# Patient Record
Sex: Female | Born: 1960 | Hispanic: Yes | Marital: Single | Smoking: Former smoker
Health system: Southern US, Community
[De-identification: ages and names within clinical notes are randomized; demographics above are authoritative.]

## PROBLEM LIST (undated history)

## (undated) ENCOUNTER — Emergency Department (HOSPITAL_COMMUNITY): Admission: EM | Payer: Self-pay | Source: Home / Self Care

## (undated) DIAGNOSIS — F32A Depression, unspecified: Secondary | ICD-10-CM

## (undated) DIAGNOSIS — R7303 Prediabetes: Secondary | ICD-10-CM

## (undated) DIAGNOSIS — F329 Major depressive disorder, single episode, unspecified: Secondary | ICD-10-CM

## (undated) DIAGNOSIS — E119 Type 2 diabetes mellitus without complications: Secondary | ICD-10-CM

## (undated) DIAGNOSIS — M1990 Unspecified osteoarthritis, unspecified site: Secondary | ICD-10-CM

## (undated) DIAGNOSIS — G8929 Other chronic pain: Secondary | ICD-10-CM

## (undated) DIAGNOSIS — J45909 Unspecified asthma, uncomplicated: Secondary | ICD-10-CM

## (undated) DIAGNOSIS — E7800 Pure hypercholesterolemia, unspecified: Secondary | ICD-10-CM

## (undated) DIAGNOSIS — I10 Essential (primary) hypertension: Secondary | ICD-10-CM

## (undated) HISTORY — DX: Unspecified asthma, uncomplicated: J45.909

## (undated) HISTORY — PX: ABDOMINAL HYSTERECTOMY: SHX81

## (undated) HISTORY — DX: Unspecified osteoarthritis, unspecified site: M19.90

## (undated) HISTORY — DX: Type 2 diabetes mellitus without complications: E11.9

## (undated) HISTORY — PX: TUBAL LIGATION: SHX77

---

## 2011-08-28 ENCOUNTER — Other Ambulatory Visit (HOSPITAL_COMMUNITY): Payer: Self-pay | Admitting: Nurse Practitioner

## 2011-08-28 DIAGNOSIS — N6452 Nipple discharge: Secondary | ICD-10-CM

## 2011-08-29 ENCOUNTER — Ambulatory Visit (HOSPITAL_COMMUNITY)
Admission: RE | Admit: 2011-08-29 | Discharge: 2011-08-29 | Disposition: A | Discharge status: Home / Self Care | Payer: PRIVATE HEALTH INSURANCE | Source: Ambulatory Visit | Attending: Nurse Practitioner | Admitting: Nurse Practitioner

## 2011-08-29 DIAGNOSIS — N644 Mastodynia: Secondary | ICD-10-CM | POA: Insufficient documentation

## 2011-08-29 DIAGNOSIS — N6459 Other signs and symptoms in breast: Secondary | ICD-10-CM | POA: Insufficient documentation

## 2011-08-29 DIAGNOSIS — N6452 Nipple discharge: Secondary | ICD-10-CM

## 2017-11-28 ENCOUNTER — Emergency Department (HOSPITAL_COMMUNITY)
Admission: EM | Admit: 2017-11-28 | Discharge: 2017-11-28 | Disposition: A | Discharge status: Home / Self Care | Payer: Self-pay | Attending: Emergency Medicine | Admitting: Emergency Medicine

## 2017-11-28 ENCOUNTER — Emergency Department (HOSPITAL_COMMUNITY): Payer: Self-pay

## 2017-11-28 ENCOUNTER — Other Ambulatory Visit: Payer: Self-pay

## 2017-11-28 ENCOUNTER — Encounter (HOSPITAL_COMMUNITY): Payer: Self-pay | Admitting: Emergency Medicine

## 2017-11-28 DIAGNOSIS — I10 Essential (primary) hypertension: Secondary | ICD-10-CM | POA: Insufficient documentation

## 2017-11-28 DIAGNOSIS — M25511 Pain in right shoulder: Secondary | ICD-10-CM | POA: Insufficient documentation

## 2017-11-28 DIAGNOSIS — M899 Disorder of bone, unspecified: Secondary | ICD-10-CM | POA: Insufficient documentation

## 2017-11-28 HISTORY — DX: Depression, unspecified: F32.A

## 2017-11-28 HISTORY — DX: Pure hypercholesterolemia, unspecified: E78.00

## 2017-11-28 HISTORY — DX: Other chronic pain: G89.29

## 2017-11-28 HISTORY — DX: Essential (primary) hypertension: I10

## 2017-11-28 HISTORY — DX: Major depressive disorder, single episode, unspecified: F32.9

## 2017-11-28 MED ORDER — IBUPROFEN 600 MG PO TABS: 600.0000 mg | ORAL_TABLET | Freq: Three times a day (TID) | ORAL | 0 refills | Status: DC

## 2017-11-28 NOTE — Discharge Instructions (Addendum)
Try applying ice packs on and off to your right shoulder.  Avoid heavy lifting with your right arm.  As discussed, the x-ray of your right shoulder shows an area of the bone that needs evaluation by an orthopedic doctor.  I have listed someone for you to call and make a follow-up appointment with.

## 2017-11-28 NOTE — ED Triage Notes (Signed)
Pt is spanish speaking and using visitor for translations. States R shoulder pain for a long period of time but two days ago became worse. Denies any injury. No improvement with home medications.

## 2017-11-28 NOTE — ED Provider Notes (Signed)
[facility] EMERGENCY DEPARTMENT Provider Note   CSN: [id number] Arrival date & time: 11/28/17  1512     History   Chief Complaint Chief Complaint  Patient presents with  . Shoulder Pain    HPI Laurie Olsen is a 57 y.o. female.  The history is limited by a language barrier. A language interpreter was used (daughter in law).  Shoulder Pain   Pertinent negatives include no numbness.     Laurie Olsen is a 57 y.o. female who presents to the Emergency Department complaining of persistent right shoulder pain.  She describes a constant sharp pain to the front of her shoulder that worsens with movement, especially trying to raise her arm up to brush her teeth or comb her hair.  He symptoms began 8 months ago, but has worsened for few days and she now states she has occasional pain that radiates down her arm.  She has tried OTC pain relievers without significant improvement.  She denies known injury, fever, chills, swelling, neck pain, headache, numbness or weakness of her arm.    Past Medical History:  Diagnosis Date  . Chronic pain   . Depression   . High cholesterol   . Hypertension     There are no active problems to display for this patient.   Past Surgical History:  Procedure Laterality Date  . ABDOMINAL HYSTERECTOMY       OB History   None      Home Medications    Prior to Admission medications   Medication Sig Start Date End Date Taking? Authorizing Provider  ibuprofen (ADVIL,MOTRIN) 600 MG tablet Take 1 tablet (600 mg total) by mouth 3 (three) times daily. Take with food 11/28/17   Kem Parkinson, PA-C    Family History History reviewed. No pertinent family history.  Social History Social History   Tobacco Use  . Smoking status: Never Smoker  . Smokeless tobacco: Never Used  Substance Use Topics  . Alcohol use: Not Currently  . Drug use: Not Currently     Allergies   Patient has no known allergies.   Review of Systems Review of Systems    Constitutional: Negative for chills and fever.  Eyes: Negative for visual disturbance.  Respiratory: Negative for chest tightness and shortness of breath.   Cardiovascular: Negative for chest pain.  Musculoskeletal: Positive for arthralgias (right shoulder pain). Negative for joint swelling, neck pain and neck stiffness.  Skin: Negative for color change and wound.  Neurological: Negative for dizziness, weakness, numbness and headaches.     Physical Exam Updated Vital Signs BP 136/82 (BP Location: Right Arm)   Pulse 97   Temp 98.8 F (37.1 C) (Oral)   Resp 14   Ht 5\' 5"  (1.651 m)   Wt 86.2 kg   SpO2 99%   BMI 31.62 kg/m   Physical Exam  Constitutional: She appears well-nourished. No distress.  HENT:  Head: Atraumatic.  Neck: Normal range of motion and full passive range of motion without pain.  Cardiovascular: Normal rate, regular rhythm and intact distal pulses.  No murmur heard. Pulmonary/Chest: Effort normal and breath sounds normal. No respiratory distress. She exhibits no tenderness.  Musculoskeletal: She exhibits tenderness. She exhibits no edema or deformity.  Focal ttp of the [facility] joint of the right shoulder.  No crepitus.  Pt unable to abduct the right arm greater than 30 degrees due to level of pain.  FROM of the right elbow and wrist.    Neurological: She is alert. No  sensory deficit.  Skin: Skin is warm. Capillary refill takes less than 2 seconds. No rash noted.  Psychiatric: She has a normal mood and affect.  Nursing note and vitals reviewed.    ED Treatments / Results  Labs (all labs ordered are listed, but only abnormal results are displayed) Labs Reviewed - No data to display  EKG None  Radiology Dg Shoulder Right  Result Date: 11/28/2017 CLINICAL DATA:  58 year old female with 2 months of right shoulder pain. Progressive symptoms x2 days but no known injury. EXAM: RIGHT SHOULDER - 2+ VIEW COMPARISON:  None. FINDINGS: There is mild speckled sclerosis  in the right humerus metadiaphysis (arrow). The proximal right humerus is intact. No periosteal reaction. Bone mineralization elsewhere appears normal. No glenohumeral joint dislocation. Right clavicle and scapula appear intact. Negative visible right chest. IMPRESSION: No acute fracture or dislocation about the right shoulder, but nonspecific sclerosis at the right proximal humerus metadiaphysis is noted. Although a benign bone lesion such as enchondroma is favored, if pain is unresponsive to conservative treatment a follow-up shoulder MRI is recommended. Electronically Signed   By: Genevie Ann M.D.   On: 11/28/2017 15:54    Procedures Procedures (including critical care time)  Medications Ordered in ED Medications - No data to display   Initial Impression / Assessment and Plan / ED Course  I have reviewed the triage vital signs and the nursing notes.  Pertinent labs & imaging results that were available during my care of the patient were reviewed by me and considered in my medical decision making (see chart for details).     XR results discussed with patient and family member.  Pain is localized to the [facility] joint and her Sx's are likely inflammatory.  she does not have bony tenderness at the site of the bony lesion.  I do not feel this is the source of her pain, but I have explained that she needs close orthopedic f/u for further evaluation.  She verbalizes understanding of this and agrees to plan.  Referral info given.  Return precautions discussed.   Final Clinical Impressions(s) / ED Diagnoses   Final diagnoses:  Acute pain of right shoulder  Bone lesion    ED Discharge Orders         Ordered    ibuprofen (ADVIL,MOTRIN) 600 MG tablet  3 times daily     11/28/17 1648           Kem Parkinson, PA-C 11/28/17 2301    Noemi Chapel, MD 11/29/17 1229

## 2019-09-28 ENCOUNTER — Other Ambulatory Visit (HOSPITAL_COMMUNITY): Payer: Self-pay | Admitting: *Deleted

## 2019-09-28 ENCOUNTER — Other Ambulatory Visit: Payer: Self-pay | Admitting: *Deleted

## 2019-09-28 DIAGNOSIS — R109 Unspecified abdominal pain: Secondary | ICD-10-CM

## 2019-09-28 DIAGNOSIS — M545 Low back pain, unspecified: Secondary | ICD-10-CM

## 2019-10-02 ENCOUNTER — Ambulatory Visit (HOSPITAL_COMMUNITY)
Admission: RE | Admit: 2019-10-02 | Discharge: 2019-10-02 | Disposition: A | Discharge status: Home / Self Care | Payer: Self-pay | Source: Ambulatory Visit | Attending: *Deleted | Admitting: *Deleted

## 2019-10-02 ENCOUNTER — Other Ambulatory Visit: Payer: Self-pay

## 2019-10-02 DIAGNOSIS — M545 Low back pain, unspecified: Secondary | ICD-10-CM

## 2019-10-02 DIAGNOSIS — R109 Unspecified abdominal pain: Secondary | ICD-10-CM

## 2020-08-11 ENCOUNTER — Other Ambulatory Visit (HOSPITAL_COMMUNITY): Payer: Self-pay | Admitting: *Deleted

## 2020-08-11 DIAGNOSIS — Z1231 Encounter for screening mammogram for malignant neoplasm of breast: Secondary | ICD-10-CM

## 2020-08-24 ENCOUNTER — Other Ambulatory Visit (HOSPITAL_COMMUNITY): Payer: Self-pay | Admitting: Obstetrics

## 2020-09-05 ENCOUNTER — Other Ambulatory Visit: Payer: Self-pay

## 2020-09-05 ENCOUNTER — Ambulatory Visit (HOSPITAL_COMMUNITY)
Admission: RE | Admit: 2020-09-05 | Discharge: 2020-09-05 | Disposition: A | Discharge status: Home / Self Care | Payer: Self-pay | Source: Ambulatory Visit | Attending: *Deleted | Admitting: *Deleted

## 2020-09-05 DIAGNOSIS — Z1231 Encounter for screening mammogram for malignant neoplasm of breast: Secondary | ICD-10-CM | POA: Insufficient documentation

## 2020-09-08 ENCOUNTER — Other Ambulatory Visit (HOSPITAL_COMMUNITY): Payer: Self-pay | Admitting: Physician Assistant

## 2020-09-08 DIAGNOSIS — R928 Other abnormal and inconclusive findings on diagnostic imaging of breast: Secondary | ICD-10-CM

## 2020-09-22 ENCOUNTER — Ambulatory Visit (HOSPITAL_COMMUNITY)
Admission: RE | Admit: 2020-09-22 | Discharge: 2020-09-22 | Disposition: A | Discharge status: Home / Self Care | Payer: Self-pay | Source: Ambulatory Visit | Attending: Physician Assistant | Admitting: Physician Assistant

## 2020-09-22 ENCOUNTER — Other Ambulatory Visit: Payer: Self-pay

## 2020-09-22 DIAGNOSIS — R928 Other abnormal and inconclusive findings on diagnostic imaging of breast: Secondary | ICD-10-CM

## 2020-09-29 ENCOUNTER — Other Ambulatory Visit: Payer: Self-pay | Admitting: Physician Assistant

## 2020-09-29 DIAGNOSIS — R921 Mammographic calcification found on diagnostic imaging of breast: Secondary | ICD-10-CM

## 2020-10-04 ENCOUNTER — Ambulatory Visit: Payer: Self-pay | Admitting: *Deleted

## 2020-10-04 ENCOUNTER — Other Ambulatory Visit: Payer: Self-pay

## 2020-10-04 VITALS — BP 136/74 | Wt 193.9 lb

## 2020-10-04 DIAGNOSIS — Z1239 Encounter for other screening for malignant neoplasm of breast: Secondary | ICD-10-CM

## 2020-10-04 NOTE — Progress Notes (Signed)
Ms. Laurie Olsen is a 60 y.o. female who presents to [facility] clinic today with no complaints. Patient referred to [place] by the [facility] Department due to having a screening mammogram completed 09/05/2020 that additional imaging of the right breast recommended for follow-up. Patient had the right breast diagnostic mammogram completed 09/22/2020 that was bi-rads 4 with a right breast stereo biopsy recommended for follow-up.   Pap Smear: Pap smear not completed today. Last Pap smear was 08/11/2020 at the [facility] clinic and was normal with positive HPV. Per patient has history of an abnormal Pap smear 16 years ago that a colposcopy and either LEEP or CKC was completed for follow-up. Last Pap smear result is not available in Epic. Last Pap smear result will be scanned into Epic.   Physical exam: Breasts Breasts symmetrical. No skin abnormalities bilateral breasts. No nipple retraction bilateral breasts. No nipple discharge bilateral breasts. No lymphadenopathy. No lumps palpated bilateral breasts. No complaints of pain or tenderness on exam.     MS DIGITAL SCREENING TOMO BILATERAL  Result Date: 09/07/2020 CLINICAL DATA:  Screening. EXAM: DIGITAL SCREENING BILATERAL MAMMOGRAM WITH TOMOSYNTHESIS AND CAD TECHNIQUE: Bilateral screening digital craniocaudal and mediolateral oblique mammograms were obtained. Bilateral screening digital breast tomosynthesis was performed. The images were evaluated with computer-aided detection. COMPARISON:  Previous exam(s). ACR Breast Density Category b: There are scattered areas of fibroglandular density. FINDINGS: In the right breast, calcifications warrant further evaluation with magnified views. In the left breast, no findings suspicious for malignancy. IMPRESSION: Further evaluation is suggested for calcifications in the right breast. RECOMMENDATION: Diagnostic mammogram of the right breast. (Code:[id number]) The patient will be contacted  regarding the findings, and additional imaging will be scheduled. BI-RADS CATEGORY  0: Incomplete. Need additional imaging evaluation and/or prior mammograms for comparison. Electronically Signed   By: Lillia Mountain M.D.   On: 09/07/2020 11:25   MS DIGITAL DIAG TOMO UNI RIGHT  Result Date: 09/22/2020 CLINICAL DATA:  Patient returns after screening study for evaluation of RIGHT breast calcifications. EXAM: DIGITAL DIAGNOSTIC UNILATERAL RIGHT MAMMOGRAM WITH TOMOSYNTHESIS AND CAD TECHNIQUE: Right digital diagnostic mammography and breast tomosynthesis was performed. The images were evaluated with computer-aided detection. COMPARISON:  Previous exam(s). ACR Breast Density Category c: The breast tissue is heterogeneously dense, which may obscure small masses. FINDINGS: Magnified views are performed of calcifications in the RIGHT breast. Magnified views show calcifications with punctate and polygonal shapes, in a linear distribution. Calcifications span 2.0 x 0.2 x 0.4 centimeters in the [place]. IMPRESSION: Indeterminate RIGHT breast calcifications. RECOMMENDATION: Stereotactic guided core biopsy of RIGHT breast calcifications. I have discussed the findings and recommendations with the patient with the assistance of an interpreter. If applicable, a reminder letter will be sent to the patient regarding the next appointment. BI-RADS CATEGORY  4: Suspicious. Electronically Signed   By: Nolon Nations M.D.   On: 09/22/2020 10:00   Pelvic/Bimanual Pap is not indicated today per BCCCP guidelines.    Smoking History: Patient is a former smoker that quit 41 years ago.   Patient Navigation: Patient education provided. Access to services provided for patient through [facility] program. Spanish interpreter Rudene Anda from [facility] provided. Patient has food insecurities. Patient escorted to the food market at the [employer] for groceries.  Colorectal Cancer Screening: Per patient has  never had colonoscopy completed. Patient completed a FIT Test 08/11/2020 that was negative. No complaints today.    Breast and Cervical Cancer Risk Assessment: Patient does not  have family history of breast cancer, known genetic mutations, or radiation treatment to the chest before age 60. Per patient has history of cervical dysplasia. Patient has no history of being immunocompromised or DES exposure in-utero.  Risk Assessment     Risk Scores       10/04/2020   Last edited by: Demetrius Revel, LPN   5-year risk: 0.7 %   Lifetime risk: 3.5 %            A: BCCCP exam without pap smear No complaints.  P: Referred patient to the [facility] for a right breast stereo biopsy per recommendation. Appointment scheduled Friday, October 07, 2020 at 1030.  Loletta Parish, RN 10/04/2020 2:03 PM

## 2020-10-04 NOTE — Patient Instructions (Addendum)
Explained breast self awareness with Alejandro Mulling. Patient did not need a Pap smear today due to last Pap smear was 08/11/2020. Let patient know that her next Pap smear is due in one year due to HPV positive. Referred patient to the [facility] for a right breast stereo biopsy per recommendation. Appointment scheduled Friday, October 07, 2020 at 1030. Patient aware of appointment and will be there. Alejandro Mulling verbalized understanding.  Guage Efferson, Arvil Chaco, RN 2:03 PM

## 2020-10-06 ENCOUNTER — Other Ambulatory Visit: Payer: Self-pay | Admitting: *Deleted

## 2020-10-07 ENCOUNTER — Ambulatory Visit
Admission: RE | Admit: 2020-10-07 | Discharge: 2020-10-07 | Disposition: A | Discharge status: Home / Self Care | Payer: No Typology Code available for payment source | Source: Ambulatory Visit | Attending: Physician Assistant | Admitting: Physician Assistant

## 2020-10-07 ENCOUNTER — Other Ambulatory Visit: Payer: Self-pay | Admitting: *Deleted

## 2020-10-07 ENCOUNTER — Other Ambulatory Visit: Payer: Self-pay

## 2020-10-07 DIAGNOSIS — R921 Mammographic calcification found on diagnostic imaging of breast: Secondary | ICD-10-CM

## 2020-10-20 ENCOUNTER — Ambulatory Visit: Payer: Self-pay | Admitting: General Surgery

## 2020-10-20 DIAGNOSIS — D241 Benign neoplasm of right breast: Secondary | ICD-10-CM

## 2020-10-20 DIAGNOSIS — N6091 Unspecified benign mammary dysplasia of right breast: Secondary | ICD-10-CM | POA: Insufficient documentation

## 2020-10-25 ENCOUNTER — Other Ambulatory Visit: Payer: Self-pay | Admitting: General Surgery

## 2020-10-25 DIAGNOSIS — R928 Other abnormal and inconclusive findings on diagnostic imaging of breast: Secondary | ICD-10-CM

## 2020-11-24 ENCOUNTER — Encounter (HOSPITAL_BASED_OUTPATIENT_CLINIC_OR_DEPARTMENT_OTHER): Payer: Self-pay | Admitting: General Surgery

## 2020-11-30 ENCOUNTER — Other Ambulatory Visit: Payer: Self-pay

## 2020-11-30 ENCOUNTER — Encounter (HOSPITAL_BASED_OUTPATIENT_CLINIC_OR_DEPARTMENT_OTHER)
Admission: RE | Admit: 2020-11-30 | Discharge: 2020-11-30 | Disposition: A | Discharge status: Home / Self Care | Payer: Medicaid Other | Source: Ambulatory Visit | Attending: General Surgery | Admitting: General Surgery

## 2020-11-30 DIAGNOSIS — I10 Essential (primary) hypertension: Secondary | ICD-10-CM | POA: Diagnosis not present

## 2020-11-30 DIAGNOSIS — Z01812 Encounter for preprocedural laboratory examination: Secondary | ICD-10-CM | POA: Diagnosis not present

## 2020-11-30 LAB — BASIC METABOLIC PANEL
Anion gap: 7 (ref 5–15)
BUN: 13 mg/dL (ref 8–23)
CO2: 29 mmol/L (ref 22–32)
Calcium: 9.1 mg/dL (ref 8.9–10.3)
Chloride: 102 mmol/L (ref 98–111)
Creatinine, Ser: 0.81 mg/dL (ref 0.44–1.00)
GFR, Estimated: >60 mL/min (ref >60)
Glucose, Bld: 98 mg/dL (ref 70–99)
Potassium: 4 mmol/L (ref 3.5–5.1)
Sodium: 138 mmol/L (ref 135–145)

## 2020-11-30 NOTE — Pre-Procedure Instructions (Signed)

## 2020-11-30 NOTE — Progress Notes (Signed)
EKG machine out of order, will do in preop on DOS

## 2020-12-01 ENCOUNTER — Other Ambulatory Visit: Payer: Self-pay

## 2020-12-01 ENCOUNTER — Ambulatory Visit
Admission: RE | Admit: 2020-12-01 | Discharge: 2020-12-01 | Disposition: A | Discharge status: Home / Self Care | Payer: Medicaid Other | Source: Ambulatory Visit | Attending: General Surgery | Admitting: General Surgery

## 2020-12-01 DIAGNOSIS — D241 Benign neoplasm of right breast: Secondary | ICD-10-CM

## 2020-12-02 ENCOUNTER — Ambulatory Visit (HOSPITAL_BASED_OUTPATIENT_CLINIC_OR_DEPARTMENT_OTHER)
Admission: RE | Admit: 2020-12-02 | Discharge: 2020-12-02 | Disposition: A | Discharge status: Home / Self Care | Payer: Medicaid Other | Attending: General Surgery | Admitting: General Surgery

## 2020-12-02 ENCOUNTER — Encounter (HOSPITAL_BASED_OUTPATIENT_CLINIC_OR_DEPARTMENT_OTHER): Payer: Self-pay | Admitting: General Surgery

## 2020-12-02 ENCOUNTER — Ambulatory Visit
Admission: RE | Admit: 2020-12-02 | Discharge: 2020-12-02 | Disposition: A | Discharge status: Home / Self Care | Payer: No Typology Code available for payment source | Source: Ambulatory Visit | Attending: General Surgery | Admitting: General Surgery

## 2020-12-02 ENCOUNTER — Ambulatory Visit (HOSPITAL_BASED_OUTPATIENT_CLINIC_OR_DEPARTMENT_OTHER): Payer: Medicaid Other | Admitting: Certified Registered"

## 2020-12-02 ENCOUNTER — Other Ambulatory Visit: Payer: Self-pay

## 2020-12-02 ENCOUNTER — Encounter (HOSPITAL_BASED_OUTPATIENT_CLINIC_OR_DEPARTMENT_OTHER)
Admission: RE | Disposition: A | Discharge status: Home / Self Care | Payer: Self-pay | Source: Home / Self Care | Attending: General Surgery

## 2020-12-02 DIAGNOSIS — Z87891 Personal history of nicotine dependence: Secondary | ICD-10-CM | POA: Insufficient documentation

## 2020-12-02 DIAGNOSIS — N62 Hypertrophy of breast: Secondary | ICD-10-CM | POA: Diagnosis not present

## 2020-12-02 DIAGNOSIS — I10 Essential (primary) hypertension: Secondary | ICD-10-CM

## 2020-12-02 DIAGNOSIS — N6091 Unspecified benign mammary dysplasia of right breast: Secondary | ICD-10-CM | POA: Diagnosis present

## 2020-12-02 DIAGNOSIS — R928 Other abnormal and inconclusive findings on diagnostic imaging of breast: Secondary | ICD-10-CM

## 2020-12-02 HISTORY — PX: BREAST LUMPECTOMY WITH RADIOACTIVE SEED LOCALIZATION: SHX6424

## 2020-12-02 HISTORY — DX: Prediabetes: R73.03

## 2020-12-02 SURGERY — BREAST LUMPECTOMY WITH RADIOACTIVE SEED LOCALIZATION
Anesthesia: General | Site: Breast | Laterality: Right

## 2020-12-02 MED ORDER — MIDAZOLAM HCL 2 MG/2ML IJ SOLN
INTRAMUSCULAR | Status: AC
  Filled 2020-12-02: qty 2

## 2020-12-02 MED ORDER — CHLORHEXIDINE GLUCONATE CLOTH 2 % EX PADS: 6.0000 | MEDICATED_PAD | Freq: Once | CUTANEOUS | Status: DC

## 2020-12-02 MED ORDER — PHENYLEPHRINE 40 MCG/ML (10ML) SYRINGE FOR IV PUSH (FOR BLOOD PRESSURE SUPPORT)
PREFILLED_SYRINGE | INTRAVENOUS | Status: AC
  Filled 2020-12-02: qty 20

## 2020-12-02 MED ORDER — CEFAZOLIN SODIUM 1 G IJ SOLR
INTRAMUSCULAR | Status: AC
  Filled 2020-12-02: qty 40

## 2020-12-02 MED ORDER — ONDANSETRON HCL 4 MG/2ML IJ SOLN
INTRAMUSCULAR | Status: DC | PRN
  Administered 2020-12-02: 4 mg via INTRAVENOUS

## 2020-12-02 MED ORDER — ONDANSETRON HCL 4 MG/2ML IJ SOLN
INTRAMUSCULAR | Status: AC
  Filled 2020-12-02: qty 2

## 2020-12-02 MED ORDER — CEFAZOLIN SODIUM-DEXTROSE 2-4 GM/100ML-% IV SOLN
INTRAVENOUS | Status: AC
  Filled 2020-12-02: qty 100

## 2020-12-02 MED ORDER — DEXMEDETOMIDINE (PRECEDEX) IN NS 20 MCG/5ML (4 MCG/ML) IV SYRINGE
PREFILLED_SYRINGE | INTRAVENOUS | Status: AC
  Filled 2020-12-02: qty 15

## 2020-12-02 MED ORDER — ACETAMINOPHEN 500 MG PO TABS
ORAL_TABLET | ORAL | Status: AC
  Filled 2020-12-02: qty 2

## 2020-12-02 MED ORDER — ARTIFICIAL TEARS OPHTHALMIC OINT
TOPICAL_OINTMENT | OPHTHALMIC | Status: AC
  Filled 2020-12-02: qty 7

## 2020-12-02 MED ORDER — FENTANYL CITRATE (PF) 100 MCG/2ML IJ SOLN: 25.0000 ug | INTRAMUSCULAR | Status: DC | PRN

## 2020-12-02 MED ORDER — CEFAZOLIN SODIUM-DEXTROSE 2-4 GM/100ML-% IV SOLN
2.0000 g | INTRAVENOUS | Status: AC
  Administered 2020-12-02: 2 g via INTRAVENOUS

## 2020-12-02 MED ORDER — BUPIVACAINE-EPINEPHRINE (PF) 0.25% -1:200000 IJ SOLN
INTRAMUSCULAR | Status: DC | PRN
  Administered 2020-12-02: 20 mL

## 2020-12-02 MED ORDER — GABAPENTIN 300 MG PO CAPS
ORAL_CAPSULE | ORAL | Status: AC
  Filled 2020-12-02: qty 1

## 2020-12-02 MED ORDER — LIDOCAINE 2% (20 MG/ML) 5 ML SYRINGE
INTRAMUSCULAR | Status: AC
  Filled 2020-12-02: qty 5

## 2020-12-02 MED ORDER — EPHEDRINE SULFATE 50 MG/ML IJ SOLN
INTRAMUSCULAR | Status: DC | PRN
  Administered 2020-12-02 (×3): 10 mg via INTRAVENOUS
  Administered 2020-12-02 (×2): 5 mg via INTRAVENOUS
  Administered 2020-12-02: 10 mg via INTRAVENOUS

## 2020-12-02 MED ORDER — EPHEDRINE 5 MG/ML INJ
INTRAVENOUS | Status: AC
  Filled 2020-12-02: qty 15

## 2020-12-02 MED ORDER — LIDOCAINE HCL (CARDIAC) PF 100 MG/5ML IV SOSY
PREFILLED_SYRINGE | INTRAVENOUS | Status: DC | PRN
  Administered 2020-12-02: 100 mg via INTRAVENOUS

## 2020-12-02 MED ORDER — SUCCINYLCHOLINE CHLORIDE 200 MG/10ML IV SOSY
PREFILLED_SYRINGE | INTRAVENOUS | Status: AC
  Filled 2020-12-02: qty 10

## 2020-12-02 MED ORDER — GABAPENTIN 300 MG PO CAPS
300.0000 mg | ORAL_CAPSULE | ORAL | Status: AC
  Administered 2020-12-02: 300 mg via ORAL

## 2020-12-02 MED ORDER — FENTANYL CITRATE (PF) 100 MCG/2ML IJ SOLN
INTRAMUSCULAR | Status: DC | PRN
  Administered 2020-12-02: 50 ug via INTRAVENOUS
  Administered 2020-12-02: 25 ug via INTRAVENOUS

## 2020-12-02 MED ORDER — PROPOFOL 10 MG/ML IV BOLUS
INTRAVENOUS | Status: AC
  Filled 2020-12-02: qty 40

## 2020-12-02 MED ORDER — KETOROLAC TROMETHAMINE 30 MG/ML IJ SOLN
INTRAMUSCULAR | Status: AC
  Filled 2020-12-02: qty 3

## 2020-12-02 MED ORDER — DEXAMETHASONE SODIUM PHOSPHATE 10 MG/ML IJ SOLN
INTRAMUSCULAR | Status: AC
  Filled 2020-12-02: qty 1

## 2020-12-02 MED ORDER — ACETAMINOPHEN 500 MG PO TABS
1000.0000 mg | ORAL_TABLET | ORAL | Status: AC
Start: 2020-12-02 — End: 2020-12-02
  Administered 2020-12-02: 1000 mg via ORAL

## 2020-12-02 MED ORDER — ATROPINE SULFATE 0.4 MG/ML IV SOLN
INTRAVENOUS | Status: AC
  Filled 2020-12-02: qty 2

## 2020-12-02 MED ORDER — PHENYLEPHRINE 40 MCG/ML (10ML) SYRINGE FOR IV PUSH (FOR BLOOD PRESSURE SUPPORT)
PREFILLED_SYRINGE | INTRAVENOUS | Status: AC
  Filled 2020-12-02: qty 10

## 2020-12-02 MED ORDER — EPHEDRINE 5 MG/ML INJ
INTRAVENOUS | Status: AC
  Filled 2020-12-02: qty 5

## 2020-12-02 MED ORDER — DEXAMETHASONE SODIUM PHOSPHATE 10 MG/ML IJ SOLN
INTRAMUSCULAR | Status: DC | PRN
  Administered 2020-12-02: 10 mg via INTRAVENOUS

## 2020-12-02 MED ORDER — LACTATED RINGERS IV SOLN: INTRAVENOUS | Status: DC

## 2020-12-02 MED ORDER — FENTANYL CITRATE (PF) 100 MCG/2ML IJ SOLN
INTRAMUSCULAR | Status: AC
  Filled 2020-12-02: qty 2

## 2020-12-02 MED ORDER — PHENYLEPHRINE HCL (PRESSORS) 10 MG/ML IV SOLN
INTRAVENOUS | Status: DC | PRN
  Administered 2020-12-02: 80 ug via INTRAVENOUS
  Administered 2020-12-02 (×5): 40 ug via INTRAVENOUS

## 2020-12-02 MED ORDER — CELECOXIB 200 MG PO CAPS
200.0000 mg | ORAL_CAPSULE | ORAL | Status: AC
  Administered 2020-12-02: 200 mg via ORAL

## 2020-12-02 MED ORDER — HYDROCODONE-ACETAMINOPHEN 5-325 MG PO TABS: 1.0000 | ORAL_TABLET | Freq: Four times a day (QID) | ORAL | 0 refills | Status: DC | PRN

## 2020-12-02 MED ORDER — MIDAZOLAM HCL 5 MG/5ML IJ SOLN
INTRAMUSCULAR | Status: DC | PRN
  Administered 2020-12-02: 2 mg via INTRAVENOUS

## 2020-12-02 MED ORDER — PROPOFOL 10 MG/ML IV BOLUS
INTRAVENOUS | Status: DC | PRN
  Administered 2020-12-02: 200 mg via INTRAVENOUS

## 2020-12-02 MED ORDER — CELECOXIB 200 MG PO CAPS
ORAL_CAPSULE | ORAL | Status: AC
  Filled 2020-12-02: qty 1

## 2020-12-02 SURGICAL SUPPLY — 45 items
ADH SKN CLS APL DERMABOND .7 (GAUZE/BANDAGES/DRESSINGS) ×1
APL PRP STRL LF DISP 70% ISPRP (MISCELLANEOUS) ×1
APPLIER CLIP 9.375 MED OPEN (MISCELLANEOUS)
APR CLP MED 9.3 20 MLT OPN (MISCELLANEOUS)
BLADE SURG 15 STRL LF DISP TIS (BLADE) ×1 IMPLANT
BLADE SURG 15 STRL SS (BLADE) ×2
CANISTER SUC SOCK COL 7IN (MISCELLANEOUS) IMPLANT
CANISTER SUCT 1200ML W/VALVE (MISCELLANEOUS) IMPLANT
CHLORAPREP W/TINT 26 (MISCELLANEOUS) ×2 IMPLANT
CLIP APPLIE 9.375 MED OPEN (MISCELLANEOUS) IMPLANT
COVER BACK TABLE 60X90IN (DRAPES) ×2 IMPLANT
COVER MAYO STAND STRL (DRAPES) ×2 IMPLANT
COVER PROBE W GEL 5X96 (DRAPES) ×2 IMPLANT
DECANTER SPIKE VIAL GLASS SM (MISCELLANEOUS) IMPLANT
DERMABOND ADVANCED (GAUZE/BANDAGES/DRESSINGS) ×1
DERMABOND ADVANCED .7 DNX12 (GAUZE/BANDAGES/DRESSINGS) ×1 IMPLANT
DRAPE LAPAROSCOPIC ABDOMINAL (DRAPES) ×2 IMPLANT
DRAPE UTILITY XL STRL (DRAPES) ×2 IMPLANT
ELECT COATED BLADE 2.86 ST (ELECTRODE) ×2 IMPLANT
ELECT REM PT RETURN 9FT ADLT (ELECTROSURGICAL) ×2
ELECTRODE REM PT RTRN 9FT ADLT (ELECTROSURGICAL) ×1 IMPLANT
GLOVE SURG ENC MOIS LTX SZ7.5 (GLOVE) ×2 IMPLANT
GLOVE SURG HYDRASOFT LTX SZ5.5 (GLOVE) ×2 IMPLANT
GLOVE SURG POLYISO LF SZ6 (GLOVE) ×2 IMPLANT
GLOVE SURG POLYISO LF SZ6.5 (GLOVE) ×2 IMPLANT
GLOVE SURG UNDER POLY LF SZ7 (GLOVE) ×4 IMPLANT
GOWN STRL REUS W/ TWL LRG LVL3 (GOWN DISPOSABLE) ×3 IMPLANT
GOWN STRL REUS W/TWL LRG LVL3 (GOWN DISPOSABLE) ×6
ILLUMINATOR WAVEGUIDE N/F (MISCELLANEOUS) IMPLANT
KIT MARKER MARGIN INK (KITS) ×2 IMPLANT
LIGHT WAVEGUIDE WIDE FLAT (MISCELLANEOUS) IMPLANT
NEEDLE HYPO 25X1 1.5 SAFETY (NEEDLE) ×2 IMPLANT
NS IRRIG 1000ML POUR BTL (IV SOLUTION) IMPLANT
PACK BASIN DAY SURGERY FS (CUSTOM PROCEDURE TRAY) ×2 IMPLANT
PENCIL SMOKE EVACUATOR (MISCELLANEOUS) ×2 IMPLANT
SLEEVE SCD COMPRESS KNEE MED (STOCKING) ×2 IMPLANT
SPONGE T-LAP 18X18 ~~LOC~~+RFID (SPONGE) ×2 IMPLANT
SUT MON AB 4-0 PC3 18 (SUTURE) ×2 IMPLANT
SUT SILK 2 0 SH (SUTURE) IMPLANT
SUT VICRYL 3-0 CR8 SH (SUTURE) ×2 IMPLANT
SYR CONTROL 10ML LL (SYRINGE) ×2 IMPLANT
TOWEL GREEN STERILE FF (TOWEL DISPOSABLE) ×2 IMPLANT
TRAY FAXITRON CT DISP (TRAY / TRAY PROCEDURE) ×2 IMPLANT
TUBE CONNECTING 20X1/4 (TUBING) IMPLANT
YANKAUER SUCT BULB TIP NO VENT (SUCTIONS) IMPLANT

## 2020-12-02 NOTE — Transfer of Care (Signed)
Immediate Anesthesia Transfer of Care Note  Patient: Laurie Olsen  Procedure(s) Performed: RIGHT BREAST LUMPECTOMY WITH RADIOACTIVE SEED LOCALIZATION (Right: Breast)  Patient Location: PACU  Anesthesia Type:General  Level of Consciousness: drowsy  Airway & Oxygen Therapy: Patient Spontanous Breathing and Patient connected to face mask oxygen  Post-op Assessment: Report given to RN and Post -op Vital signs reviewed and stable  Post vital signs: Reviewed and stable  Last Vitals:  Vitals Value Taken Time  BP 116/60 12/02/20 1027  Temp    Pulse 85 12/02/20 1031  Resp 15 12/02/20 1031  SpO2 93 % 12/02/20 1031  Vitals shown include unvalidated device data.  Last Pain:  Vitals:   12/02/20 0847  TempSrc: Oral  PainSc: 0-No pain      Patients Stated Pain Goal: 3 (84/16/60 6301)  Complications: No notable events documented.

## 2020-12-02 NOTE — H&P (Signed)
REFERRING PHYSICIAN: Muse, Oneita Jolly, *  PROVIDER: Landry Corporal, MD  MRN: [id number] DOB: Nov 03, 1960 Subjective  Chief Complaint: No chief complaint on file.   History of Present Illness: Laurie Olsen is a 60 y.o. female who is seen today as an office consultation at the request of Dr. Darleene Cleaver for evaluation of No chief complaint on file. .   We are asked to see the patient in consultation by Dr. Darleene Cleaver to evaluate her for atypical ductal hyperplasia of the right breast. The patient is a 60 year old Hispanic female who recently went for a routine screening mammogram. At that time she was found to have a 2 cm area of calcification in the lower outer quadrant of the right breast. This was biopsied and came back as atypical ductal hyperplasia. She has no family history of breast cancer. She is otherwise in pretty good health. She quit smoking about 40 years ago.  Review of Systems: A complete review of systems was obtained from the patient. I have reviewed this information and discussed as appropriate with the patient. See HPI as well for other ROS.  ROS   Medical History: Past Medical History:  Diagnosis Date   Anxiety   Arthritis   Asthma, unspecified asthma severity, unspecified whether complicated, unspecified whether persistent   Hyperlipidemia   Hypertension   Patient Active Problem List  Diagnosis   Atypical ductal hyperplasia of right breast   History reviewed. No pertinent surgical history.   No Known Allergies  Current Outpatient Medications on File Prior to Visit  Medication Sig Dispense Refill   atorvastatin (LIPITOR) 40 MG tablet   losartan (COZAAR) 50 MG tablet   No current facility-administered medications on file prior to visit.   Family History  Problem Relation Age of Onset   High blood pressure (Hypertension) Mother   Heart valve disease Mother   Diabetes Mother   Stroke Father   High blood pressure (Hypertension) Father   Heart valve disease  Father   Stroke Sister   High blood pressure (Hypertension) Sister   Hyperlipidemia (Elevated cholesterol) Sister   Diabetes Sister   Hyperlipidemia (Elevated cholesterol) Brother   Heart valve disease Brother   Diabetes Brother    Social History   Tobacco Use  Smoking Status Former Smoker  Smokeless Tobacco Never Used    Social History   Socioeconomic History   Marital status: Single  Tobacco Use   Smoking status: Former Smoker   Smokeless tobacco: Never Used  Scientific laboratory technician Use: Never used  Substance and Sexual Activity   Alcohol use: Never   Drug use: Never   Objective:   Vitals:  BP: 132/78  Pulse: 82  Temp: 36.7 C (98.1 F)  SpO2: 98%  Weight: 89.2 kg (196 lb 9.6 oz)  Height: 160 cm (5\' 3" )   Body mass index is 34.83 kg/m.  Physical Exam Vitals reviewed.  Constitutional:  General: She is not in acute distress. Appearance: Normal appearance.  HENT:  Head: Normocephalic and atraumatic.  Right Ear: External ear normal.  Left Ear: External ear normal.  Nose: Nose normal.  Mouth/Throat:  Mouth: Mucous membranes are moist.  Pharynx: Oropharynx is clear.  Eyes:  General: No scleral icterus. Extraocular Movements: Extraocular movements intact.  Conjunctiva/sclera: Conjunctivae normal.  Pupils: Pupils are equal, round, and reactive to light.  Cardiovascular:  Rate and Rhythm: Normal rate and regular rhythm.  Pulses: Normal pulses.  Heart sounds: Normal heart sounds.  Pulmonary:  Effort: Pulmonary effort is normal.  No respiratory distress.  Breath sounds: Normal breath sounds.  Abdominal:  General: Bowel sounds are normal.  Palpations: Abdomen is soft.  Tenderness: There is no abdominal tenderness.  Musculoskeletal:  General: No swelling, tenderness or deformity. Normal range of motion.  Cervical back: Normal range of motion and neck supple.  Skin: General: Skin is warm and dry.  Coloration: Skin is not jaundiced.  Neurological:   General: No focal deficit present.  Mental Status: She is alert and oriented to person, place, and time.  Psychiatric:  Mood and Affect: Mood normal.  Behavior: Behavior normal.   Breast: There is no palpable mass in either breast. There is no palpable axillary, supraclavicular, or cervical lymphadenopathy  Labs, Imaging and Diagnostic Testing:  Assessment and Plan:  Diagnoses and all orders for this visit:  Atypical ductal hyperplasia of right breast - Ambulatory Referral to Oncology-Medical - [facility] Case Posting Request; Future    The patient appears to have a 2 cm area of atypical ductal hyperplasia in the lower outer quadrant of the right breast. Because this is considered a high risk lesion and because its appearance can be similar to DCIS my recommendation would be to have this area removed. I have discussed with her in detail the risks and benefits of the operation as well as some of the technical aspects including the use of a radioactive seed for localization and she understands and wishes to proceed. I will also refer her to the high risk clinic at the cancer center to talk about risk reduction. We did communicate through a family interpreter

## 2020-12-02 NOTE — Interval H&P Note (Signed)
History and Physical Interval Note:  12/02/2020 9:14 AM  Laurie Olsen  has presented today for surgery, with the diagnosis of RIGHT BREAST ADH.  The various methods of treatment have been discussed with the patient and family. After consideration of risks, benefits and other options for treatment, the patient has consented to  Procedure(s): RIGHT BREAST LUMPECTOMY WITH RADIOACTIVE SEED LOCALIZATION (Right) as a surgical intervention.  The patient's history has been reviewed, patient examined, no change in status, stable for surgery.  I have reviewed the patient's chart and labs.  Questions were answered to the patient's satisfaction.     [name]

## 2020-12-02 NOTE — Op Note (Signed)
12/02/2020  10:22 AM  PATIENT:  Laurie Olsen  60 y.o. female  PRE-OPERATIVE DIAGNOSIS:  RIGHT BREAST ATYPICAL DUCT HYPERPLASIA  POST-OPERATIVE DIAGNOSIS:  RIGHT BREAST ATYPICAL DUCT HYPERPLASIA  PROCEDURE:  Procedure(s): RIGHT BREAST LUMPECTOMY WITH RADIOACTIVE SEED LOCALIZATION (Right)  SURGEON:  Surgeon(s) and Role:    * Jovita Kussmaul, MD - Primary  PHYSICIAN ASSISTANT:   ASSISTANTS: none   ANESTHESIA:   local and general  EBL:  5 mL   BLOOD ADMINISTERED:none  DRAINS: none   LOCAL MEDICATIONS USED:  MARCAINE     SPECIMEN:  Source of Specimen:  right breast tissue  DISPOSITION OF SPECIMEN:  PATHOLOGY  COUNTS:  YES  TOURNIQUET:  * No tourniquets in log *  DICTATION: .Dragon Dictation  After informed consent was obtained the patient was brought to the operating room and placed in the supine position on the operating table.  After adequate induction of general anesthesia the patient's right breast was prepped with ChloraPrep, allowed to dry, and draped in usual sterile manner.  An appropriate timeout was performed.  Previously an I-125 seed was placed in the lower outer right breast to mark an area of atypical ductal hyperplasia.  The neoprobe was set to I-125 in the area of radioactivity was readily identified.  The area around this was infiltrated with quarter percent Marcaine.  A curvilinear incision was made along the lower outer edge of the areola with a 15 blade knife.  The incision was carried through the skin and subcutaneous tissue sharply with the electrocautery.  Dissection was then carried out in the lower outer quadrant of the right breast between the breast tissue and the subcutaneous fat and skin.  This dissection was continued until we were well beyond the area of the radioactive seed.  Next a circular portion of breast tissue was then excised sharply around the radioactive seed while checking the area of radioactivity frequently.  Once the specimen was removed  it was oriented with the appropriate paint colors.  A specimen radiograph was obtained that showed the clip and seed to be near the center of the specimen.  The specimen was then sent to pathology for further evaluation.  Hemostasis was achieved using the Bovie electrocautery.  The wound was irrigated with saline and infiltrated with more quarter percent Marcaine.  The deep layer of the wound was closed with interrupted 3-0 Vicryl stitches.  The skin was then closed with interrupted 4-0 Monocryl subcuticular stitches.  Dermabond dressings were applied.  The patient tolerated the procedure well.  At the end of the case all needle sponge and instrument counts were correct.  The patient was then awakened and taken to recovery in stable condition.  PLAN OF CARE: Discharge to home after PACU  PATIENT DISPOSITION:  PACU - hemodynamically stable.   Delay start of Pharmacological VTE agent (>24hrs) due to surgical blood loss or risk of bleeding: not applicable

## 2020-12-02 NOTE — Anesthesia Preprocedure Evaluation (Signed)
Anesthesia Evaluation  Patient identified by MRN, date of birth, ID band Patient awake    Reviewed: Allergy & Precautions, NPO status , Patient's Chart, lab work & pertinent test results  Airway Mallampati: II  TM Distance: >3 FB Neck ROM: Full    Dental no notable dental hx.    Pulmonary asthma , former smoker,    Pulmonary exam normal        Cardiovascular hypertension, Pt. on medications  Rhythm:Regular Rate:Normal     Neuro/Psych Depression    GI/Hepatic negative GI ROS, Neg liver ROS,   Endo/Other  negative endocrine ROS  Renal/GU negative Renal ROS  negative genitourinary   Musculoskeletal Right breast ADH   Abdominal Normal abdominal exam  (+)   Peds  Hematology negative hematology ROS (+)   Anesthesia Other Findings   Reproductive/Obstetrics                             Anesthesia Physical Anesthesia Plan  ASA: 2  Anesthesia Plan: General   Post-op Pain Management:    Induction: Intravenous  PONV Risk Score and Plan: 3 and Ondansetron, Dexamethasone, Midazolam and Treatment may vary due to age or medical condition  Airway Management Planned: Mask and LMA  Additional Equipment: None  Intra-op Plan:   Post-operative Plan: Extubation in OR  Informed Consent: I have reviewed the patients History and Physical, chart, labs and discussed the procedure including the risks, benefits and alternatives for the proposed anesthesia with the patient or authorized representative who has indicated his/her understanding and acceptance.     Dental advisory given  Plan Discussed with: CRNA  Anesthesia Plan Comments:         Anesthesia Quick Evaluation

## 2020-12-02 NOTE — Anesthesia Postprocedure Evaluation (Signed)
Anesthesia Post Note  Patient: Laurie Olsen  Procedure(s) Performed: RIGHT BREAST LUMPECTOMY WITH RADIOACTIVE SEED LOCALIZATION (Right: Breast)     Patient location during evaluation: PACU Anesthesia Type: General Level of consciousness: awake and alert Pain management: pain level controlled Vital Signs Assessment: post-procedure vital signs reviewed and stable Respiratory status: spontaneous breathing, nonlabored ventilation, respiratory function stable and patient connected to nasal cannula oxygen Cardiovascular status: blood pressure returned to baseline and stable Postop Assessment: no apparent nausea or vomiting Anesthetic complications: no   No notable events documented.  Last Vitals:  Vitals:   12/02/20 1115 12/02/20 1136  BP: 126/68 129/70  Pulse: 83 91  Resp: 15 16  Temp:  36.6 C  SpO2: 97% 100%    Last Pain:  Vitals:   12/02/20 1136  TempSrc:   PainSc: 2                  March Rummage Tasheka Houseman

## 2020-12-02 NOTE — Discharge Instructions (Signed)
  Post Anesthesia Home Care Instructions  Activity: Get plenty of rest for the remainder of the day. A responsible individual must stay with you for 24 hours following the procedure.  For the next 24 hours, DO NOT: -Drive a car -Paediatric nurse -Drink alcoholic beverages -Take any medication unless instructed by your physician -Make any legal decisions or sign important papers.  Meals: Start with liquid foods such as gelatin or soup. Progress to regular foods as tolerated. Avoid greasy, spicy, heavy foods. If nausea and/or vomiting occur, drink only clear liquids until the nausea and/or vomiting subsides. Call your physician if vomiting continues.  Special Instructions/Symptoms: Your throat may feel dry or sore from the anesthesia or the breathing tube placed in your throat during surgery. If this causes discomfort, gargle with warm salt water. The discomfort should disappear within 24 hours.  If you had a scopolamine patch placed behind your ear for the management of post- operative nausea and/or vomiting:  1. The medication in the patch is effective for 72 hours, after which it should be removed.  Wrap patch in a tissue and discard in the trash. Wash hands thoroughly with soap and water. 2. You may remove the patch earlier than 72 hours if you experience unpleasant side effects which may include dry mouth, dizziness or visual disturbances. 3. Avoid touching the patch. Wash your hands with soap and water after contact with the patch.      Next dose of Tylenol/NSAIDs after 2:50pm as needed for pain.

## 2020-12-02 NOTE — Anesthesia Procedure Notes (Signed)
Procedure Name: LMA Insertion Date/Time: 12/02/2020 9:42 AM Performed by: Lavonia Dana, CRNA Pre-anesthesia Checklist: Patient identified, Emergency Drugs available, Suction available and Patient being monitored Patient Re-evaluated:Patient Re-evaluated prior to induction Oxygen Delivery Method: Circle system utilized Preoxygenation: Pre-oxygenation with 100% oxygen Induction Type: IV induction Ventilation: Mask ventilation without difficulty LMA: LMA inserted LMA Size: 4.0 Number of attempts: 1 Airway Equipment and Method: Bite block Placement Confirmation: positive ETCO2 Tube secured with: Tape Dental Injury: Teeth and Oropharynx as per pre-operative assessment

## 2020-12-05 ENCOUNTER — Encounter (HOSPITAL_BASED_OUTPATIENT_CLINIC_OR_DEPARTMENT_OTHER): Payer: Self-pay | Admitting: General Surgery

## 2020-12-07 LAB — SURGICAL PATHOLOGY

## 2021-02-20 ENCOUNTER — Telehealth: Payer: Self-pay | Admitting: Hematology and Oncology

## 2021-02-20 NOTE — Telephone Encounter (Signed)
Scheduled appt per 1/16 referral. Used interpreter services to sch appt. Pt is aware of appt date and time

## 2021-03-07 ENCOUNTER — Inpatient Hospital Stay: Payer: Medicaid Other

## 2021-03-07 ENCOUNTER — Inpatient Hospital Stay: Payer: Medicaid Other | Attending: Hematology and Oncology | Admitting: Hematology and Oncology

## 2021-03-14 ENCOUNTER — Telehealth: Payer: Self-pay | Admitting: Hematology and Oncology

## 2021-03-14 NOTE — Telephone Encounter (Signed)
Pt missed her new high risk appt. I spoke to pt using interpreter services. She is aware of new appt date and time.

## 2021-03-23 ENCOUNTER — Encounter (HOSPITAL_COMMUNITY): Payer: Self-pay

## 2021-03-31 ENCOUNTER — Inpatient Hospital Stay: Payer: Medicaid Other | Attending: Hematology and Oncology | Admitting: Hematology and Oncology

## 2021-03-31 ENCOUNTER — Inpatient Hospital Stay: Payer: Medicaid Other

## 2021-04-04 ENCOUNTER — Telehealth: Payer: Self-pay | Admitting: Hematology and Oncology

## 2021-04-04 NOTE — Telephone Encounter (Signed)
R/s pt's missed new high risk appt using interpreter services. Pt is aware of new appt date and time.  ?

## 2021-04-25 ENCOUNTER — Inpatient Hospital Stay: Payer: Medicaid Other

## 2021-04-25 ENCOUNTER — Inpatient Hospital Stay: Payer: Medicaid Other | Admitting: Hematology and Oncology

## 2022-07-03 ENCOUNTER — Observation Stay (HOSPITAL_COMMUNITY)
Admission: EM | Admit: 2022-07-03 | Discharge: 2022-07-04 | Disposition: A | Discharge status: Home / Self Care | Payer: Medicaid Other | Attending: Family Medicine | Admitting: Family Medicine

## 2022-07-03 ENCOUNTER — Emergency Department (HOSPITAL_COMMUNITY): Payer: Medicaid Other

## 2022-07-03 ENCOUNTER — Encounter (HOSPITAL_COMMUNITY): Payer: Self-pay | Admitting: Emergency Medicine

## 2022-07-03 ENCOUNTER — Other Ambulatory Visit: Payer: Self-pay

## 2022-07-03 DIAGNOSIS — J45998 Other asthma: Secondary | ICD-10-CM | POA: Insufficient documentation

## 2022-07-03 DIAGNOSIS — R7989 Other specified abnormal findings of blood chemistry: Secondary | ICD-10-CM | POA: Insufficient documentation

## 2022-07-03 DIAGNOSIS — R7303 Prediabetes: Secondary | ICD-10-CM | POA: Diagnosis present

## 2022-07-03 DIAGNOSIS — I10 Essential (primary) hypertension: Secondary | ICD-10-CM | POA: Diagnosis present

## 2022-07-03 DIAGNOSIS — Z79899 Other long term (current) drug therapy: Secondary | ICD-10-CM | POA: Insufficient documentation

## 2022-07-03 DIAGNOSIS — R55 Syncope and collapse: Principal | ICD-10-CM | POA: Diagnosis present

## 2022-07-03 DIAGNOSIS — Z1152 Encounter for screening for COVID-19: Secondary | ICD-10-CM | POA: Diagnosis not present

## 2022-07-03 DIAGNOSIS — E876 Hypokalemia: Secondary | ICD-10-CM | POA: Diagnosis present

## 2022-07-03 DIAGNOSIS — J45909 Unspecified asthma, uncomplicated: Secondary | ICD-10-CM | POA: Diagnosis not present

## 2022-07-03 DIAGNOSIS — Z87891 Personal history of nicotine dependence: Secondary | ICD-10-CM | POA: Insufficient documentation

## 2022-07-03 DIAGNOSIS — J42 Unspecified chronic bronchitis: Secondary | ICD-10-CM | POA: Insufficient documentation

## 2022-07-03 LAB — CBC WITH DIFFERENTIAL/PLATELET
Abs Immature Granulocytes: 0.01 10*3/uL (ref 0.00–0.07)
Basophils Absolute: 0 10*3/uL (ref 0.0–0.1)
Basophils Relative: 1 %
Eosinophils Absolute: 0 10*3/uL (ref 0.0–0.5)
Eosinophils Relative: 0 %
HCT: 38.5 % (ref 36.0–46.0)
Hemoglobin: 12.5 g/dL (ref 12.0–15.0)
Immature Granulocytes: 0 %
Lymphocytes Relative: 40 %
Lymphs Abs: 1.2 10*3/uL (ref 0.7–4.0)
MCH: 26.5 pg (ref 26.0–34.0)
MCHC: 32.5 g/dL (ref 30.0–36.0)
MCV: 81.6 fL (ref 80.0–100.0)
Monocytes Absolute: 0.3 10*3/uL (ref 0.1–1.0)
Monocytes Relative: 11 %
Neutro Abs: 1.4 10*3/uL — ABNORMAL LOW (ref 1.7–7.7)
Neutrophils Relative %: 48 %
Platelets: 222 10*3/uL (ref 150–400)
RBC: 4.72 MIL/uL (ref 3.87–5.11)
RDW: 15.7 % — ABNORMAL HIGH (ref 11.5–15.5)
WBC: 2.9 10*3/uL — ABNORMAL LOW (ref 4.0–10.5)
nRBC: 0 % (ref 0.0–0.2)

## 2022-07-03 LAB — COMPREHENSIVE METABOLIC PANEL
ALT: 42 U/L (ref 0–44)
AST: 54 U/L — ABNORMAL HIGH (ref 15–41)
Albumin: 3.9 g/dL (ref 3.5–5.0)
Alkaline Phosphatase: 76 U/L (ref 38–126)
Anion gap: 13 (ref 5–15)
BUN: 17 mg/dL (ref 8–23)
CO2: 22 mmol/L (ref 22–32)
Calcium: 8.7 mg/dL — ABNORMAL LOW (ref 8.9–10.3)
Chloride: 97 mmol/L — ABNORMAL LOW (ref 98–111)
Creatinine, Ser: 1.18 mg/dL — ABNORMAL HIGH (ref 0.44–1.00)
GFR, Estimated: 52 mL/min — ABNORMAL LOW (ref >60)
Glucose, Bld: 129 mg/dL — ABNORMAL HIGH (ref 70–99)
Potassium: 3.1 mmol/L — ABNORMAL LOW (ref 3.5–5.1)
Sodium: 132 mmol/L — ABNORMAL LOW (ref 135–145)
Total Bilirubin: 0.7 mg/dL (ref 0.3–1.2)
Total Protein: 8 g/dL (ref 6.5–8.1)

## 2022-07-03 LAB — TROPONIN I (HIGH SENSITIVITY)
Troponin I (High Sensitivity): 16 ng/L (ref <18)
Troponin I (High Sensitivity): 34 ng/L — ABNORMAL HIGH (ref <18)
Troponin I (High Sensitivity): 22 ng/L — ABNORMAL HIGH (ref <18)
Troponin I (High Sensitivity): 16 ng/L (ref <18)

## 2022-07-03 LAB — URINALYSIS, W/ REFLEX TO CULTURE (INFECTION SUSPECTED)
Bilirubin Urine: NEGATIVE
Glucose, UA: NEGATIVE mg/dL
Hgb urine dipstick: NEGATIVE
Ketones, ur: NEGATIVE mg/dL
Leukocytes,Ua: NEGATIVE
Nitrite: NEGATIVE
Protein, ur: NEGATIVE mg/dL
Specific Gravity, Urine: 1.021 (ref 1.005–1.030)
pH: 6 (ref 5.0–8.0)

## 2022-07-03 LAB — LIPASE, BLOOD: Lipase: 57 U/L — ABNORMAL HIGH (ref 11–51)

## 2022-07-03 LAB — MAGNESIUM: Magnesium: 2.2 mg/dL (ref 1.7–2.4)

## 2022-07-03 LAB — SARS CORONAVIRUS 2 BY RT PCR: SARS Coronavirus 2 by RT PCR: NEGATIVE

## 2022-07-03 LAB — D-DIMER, QUANTITATIVE: D-Dimer, Quant: 0.82 ug/mL-FEU — ABNORMAL HIGH (ref 0.00–0.50)

## 2022-07-03 MED ORDER — ALBUTEROL SULFATE (2.5 MG/3ML) 0.083% IN NEBU
2.5000 mg | INHALATION_SOLUTION | Freq: Once | RESPIRATORY_TRACT | Status: AC
  Administered 2022-07-03: 2.5 mg via RESPIRATORY_TRACT
  Filled 2022-07-03: qty 3

## 2022-07-03 MED ORDER — ENOXAPARIN SODIUM 40 MG/0.4ML IJ SOSY
40.0000 mg | PREFILLED_SYRINGE | INTRAMUSCULAR | Status: DC
  Administered 2022-07-03: 40 mg via SUBCUTANEOUS
  Filled 2022-07-03: qty 0.4

## 2022-07-03 MED ORDER — POTASSIUM CHLORIDE IN NACL 40-0.9 MEQ/L-% IV SOLN: INTRAVENOUS | Status: DC

## 2022-07-03 MED ORDER — LACTATED RINGERS IV BOLUS
1000.0000 mL | Freq: Once | INTRAVENOUS | Status: AC
  Administered 2022-07-03: 1000 mL via INTRAVENOUS

## 2022-07-03 MED ORDER — ASPIRIN 81 MG PO CHEW
324.0000 mg | CHEWABLE_TABLET | Freq: Once | ORAL | Status: AC
  Administered 2022-07-03: 324 mg via ORAL
  Filled 2022-07-03: qty 4

## 2022-07-03 MED ORDER — HYDROCODONE-ACETAMINOPHEN 5-325 MG PO TABS
1.0000 | ORAL_TABLET | Freq: Four times a day (QID) | ORAL | Status: DC | PRN
  Administered 2022-07-03 – 2022-07-04 (×2): 1 via ORAL
  Filled 2022-07-03 (×2): qty 1

## 2022-07-03 MED ORDER — ACETAMINOPHEN 650 MG RE SUPP: 650.0000 mg | Freq: Four times a day (QID) | RECTAL | Status: DC | PRN

## 2022-07-03 MED ORDER — POTASSIUM CHLORIDE CRYS ER 20 MEQ PO TBCR
40.0000 meq | EXTENDED_RELEASE_TABLET | Freq: Once | ORAL | Status: AC
  Administered 2022-07-03: 40 meq via ORAL
  Filled 2022-07-03: qty 2

## 2022-07-03 MED ORDER — POLYETHYLENE GLYCOL 3350 17 G PO PACK: 17.0000 g | PACK | Freq: Every day | ORAL | Status: DC | PRN

## 2022-07-03 MED ORDER — IOHEXOL 350 MG/ML SOLN
75.0000 mL | Freq: Once | INTRAVENOUS | Status: AC | PRN
  Administered 2022-07-03: 75 mL via INTRAVENOUS

## 2022-07-03 MED ORDER — ACETAMINOPHEN 325 MG PO TABS
650.0000 mg | ORAL_TABLET | Freq: Four times a day (QID) | ORAL | Status: DC | PRN
  Administered 2022-07-04: 650 mg via ORAL
  Filled 2022-07-03: qty 2

## 2022-07-03 MED ORDER — ALBUTEROL SULFATE (2.5 MG/3ML) 0.083% IN NEBU
2.5000 mg | INHALATION_SOLUTION | RESPIRATORY_TRACT | Status: DC | PRN
  Administered 2022-07-04: 2.5 mg via RESPIRATORY_TRACT
  Filled 2022-07-03: qty 3

## 2022-07-03 NOTE — H&P (Signed)
History and Physical    Laurie Olsen:[id number] DOB: 22-Jan-1961 DOA: 07/03/2022  PCP: [facility], [facility]   Patient coming from: Home  I have personally briefly reviewed patient's old medical records in [facility] Link  Chief Complaint: Syncope  HPI: Laurie Olsen is a 62 y.o. female with medical history significant for asthma, depression, hypertension, prediabetes.  Patient is Spanish-speaking, she is able to communicate in little but sufficient English to give me history.  Husband is at bedside, patient was in the waiting room when she passed out.  Her son (in his 26s) is ill and was transferred to [facility], for abdominal pathology- she is unable to tell me more details. She has been feeling sick over the past 3 days, with poor oral intake over the past 3 days due to the poor appetite.  No vomiting no loose stools.  No urinary symptoms.  She reports bilateral lower rib chest pain that hurts with any degree of movement at all.  She reports difficulty breathing over the past few days for which she used her asthma inhaler but she has run out.  She reports minimal nonproductive cough.  Prior to passing out she felt dizzy.  Per ED staff she was out for about 30 seconds. When she came to, she was diaphoretic.  ED Course: Patient 98.4.  Heart rate 70s to 80s.  Respiratory rate 14-19.  Blood pressure systolic 120s to 811B.  O2 sat greater than 94% on room air.  Troponin 16 > 34.  D-dimer 0.8.  Potassium 3.1, sodium 132.  UA with rare bacteria.   CTA chest negative for PE, findings compatible with bronchitis/bronchiolitis. 1 L bolus Given. EDP cardiologist Dr. Cindra Eves, recommended echo, possible stress cardiomyopathy, give fluids.  Review of Systems: As per HPI all other systems reviewed and negative.  Past Medical History:  Diagnosis Date   Arthritis    Asthma    Chronic pain    Depression    High cholesterol    Hypertension    Pre-diabetes     Past Surgical History:   Procedure Laterality Date   BREAST LUMPECTOMY WITH RADIOACTIVE SEED LOCALIZATION Right 12/02/2020   Procedure: RIGHT BREAST LUMPECTOMY WITH RADIOACTIVE SEED LOCALIZATION;  Surgeon: Griselda Miner, MD;  Location: [facility];  Service: General;  Laterality: Right;   TUBAL LIGATION       reports that she quit smoking about 44 years ago. Her smoking use included cigarettes. She has never used smokeless tobacco. She reports that she does not currently use alcohol. She reports that she does not use drugs.  No Known Allergies  Family History  Problem Relation Age of Onset   Heart disease Mother    Heart disease Father    Heart disease Brother     Prior to Admission medications   Medication Sig Start Date End Date Taking? Authorizing Provider  hydrochlorothiazide (HYDRODIURIL) 25 MG tablet Take 25 mg by mouth daily. Patient not taking: Reported on 07/03/2022 01/14/22   [provider]  ibuprofen (ADVIL,MOTRIN) 600 MG tablet Take 1 tablet (600 mg total) by mouth 3 (three) times daily. Take with food Patient not taking: Reported on 07/03/2022 11/28/17   Pauline Aus, PA-C    Physical Exam: Vitals:   07/03/22 1730 07/03/22 1800 07/03/22 1830 07/03/22 1900  BP: 135/74 (!) 145/82 (!) 152/72 128/79  Pulse: 81 81 89 83  Resp: 15 15 19 14   Temp:    98.2 F (36.8 C)  TempSrc:  SpO2: 100% 97%  96%  Weight:      Height:        Constitutional: NAD, calm, comfortable Vitals:   07/03/22 1730 07/03/22 1800 07/03/22 1830 07/03/22 1900  BP: 135/74 (!) 145/82 (!) 152/72 128/79  Pulse: 81 81 89 83  Resp: 15 15 19 14   Temp:    98.2 F (36.8 C)  TempSrc:      SpO2: 100% 97%  96%  Weight:      Height:       Eyes: PERRL, lids and conjunctivae normal ENMT: Mucous membranes are moist.   Neck: normal, supple, no masses, no thyromegaly Respiratory: Very faint and sparse occasional expiratory wheezing at the bases, Normal respiratory effort. No accessory muscle use.   Cardiovascular: Regular rate and rhythm, no murmurs / rubs / gallops. No extremity edema.  Extremities warm. Abdomen: no tenderness, no masses palpated. No hepatosplenomegaly. Bowel sounds positive.  Musculoskeletal: no clubbing / cyanosis. No joint deformity upper and lower extremities. Good ROM, no contractures. Normal muscle tone.  Skin: no rashes, lesions, ulcers. No induration Neurologic: No facial asymmetry.  5/5 strength in all extremities. Psychiatric: Normal judgment and insight. Alert and oriented x 3. Normal mood.   Labs on Admission: I have personally reviewed following labs and imaging studies  CBC: Recent Labs  Lab 07/03/22 1550  WBC 2.9*  NEUTROABS 1.4*  HGB 12.5  HCT 38.5  MCV 81.6  PLT 222   Basic Metabolic Panel: Recent Labs  Lab 07/03/22 1550  NA 132*  K 3.1*  CL 97*  CO2 22  GLUCOSE 129*  BUN 17  CREATININE 1.18*  CALCIUM 8.7*   GFR: Estimated Creatinine Clearance: 52.1 mL/min (A) (by C-G formula based on SCr of 1.18 mg/dL (H)). Liver Function Tests: Recent Labs  Lab 07/03/22 1550  AST 54*  ALT 42  ALKPHOS 76  BILITOT 0.7  PROT 8.0  ALBUMIN 3.9   Recent Labs  Lab 07/03/22 1550  LIPASE 57*   Urine analysis:    Component Value Date/Time   COLORURINE YELLOW 07/03/2022 1718   APPEARANCEUR CLEAR 07/03/2022 1718   LABSPEC 1.021 07/03/2022 1718   PHURINE 6.0 07/03/2022 1718   GLUCOSEU NEGATIVE 07/03/2022 1718   HGBUR NEGATIVE 07/03/2022 1718   BILIRUBINUR NEGATIVE 07/03/2022 1718   KETONESUR NEGATIVE 07/03/2022 1718   PROTEINUR NEGATIVE 07/03/2022 1718   NITRITE NEGATIVE 07/03/2022 1718   LEUKOCYTESUR NEGATIVE 07/03/2022 1718    Radiological Exams on Admission: CT Angio Chest PE W and/or Wo Contrast  Result Date: 07/03/2022 CLINICAL DATA:  High probability for PE.  Fever. EXAM: CT ANGIOGRAPHY CHEST WITH CONTRAST TECHNIQUE: Multidetector CT imaging of the chest was performed using the standard protocol during bolus administration of  intravenous contrast. Multiplanar CT image reconstructions and MIPs were obtained to evaluate the vascular anatomy. RADIATION DOSE REDUCTION: This exam was performed according to the departmental dose-optimization program which includes automated exposure control, adjustment of the mA and/or kV according to patient size and/or use of iterative reconstruction technique. CONTRAST:  75mL OMNIPAQUE IOHEXOL 350 MG/ML SOLN COMPARISON:  Chest x-ray same day FINDINGS: Cardiovascular: Satisfactory opacification of the pulmonary arteries to the segmental level. No evidence of pulmonary embolism. Normal heart size. No pericardial effusion. Mediastinum/Nodes: There are prominent, but nonenlarged mediastinal and hilar lymph nodes diffusely. Esophagus and visualized thyroid gland are within normal limits. Lungs/Pleura: There are areas of air trapping diffusely in both lungs. There is no lung consolidation, pleural effusion or pneumothorax. There central peribronchial wall thickening.  Trachea and central airways are patent. Upper Abdomen: No acute abnormality. Musculoskeletal: No chest wall abnormality. No acute or significant osseous findings. Review of the MIP images confirms the above findings. IMPRESSION: 1. No evidence for pulmonary embolism. 2. Central peribronchial wall thickening with areas of air trapping diffusely in both lungs. Findings are compatible with bronchitis/bronchiolitis. 3. Prominent, but nonenlarged mediastinal and hilar lymph nodes are likely reactive. Electronically Signed   By: Darliss Cheney M.D.   On: 07/03/2022 17:38   DG Chest Portable 1 View  Result Date: 07/03/2022 CLINICAL DATA:  Syncope EXAM: PORTABLE CHEST 1 VIEW COMPARISON:  None Available. FINDINGS: The heart size and mediastinal contours are within normal limits. Low lung volumes accentuate pulmonary vascularity. Both lungs are clear. The visualized skeletal structures are unremarkable. IMPRESSION: No active disease. Electronically Signed    By: Minerva Fester M.D.   On: 07/03/2022 16:31    EKG: Independently reviewed.  Sinus rhythm, rate 86, QTc 459.  T wave abnormalities in lead III and aVF unchanged from prior.  Assessment/Plan Principal Problem:   Syncope Active Problems:   Asthma, chronic   HTN (hypertension)   Pre-diabetes   Assessment and Plan: * Syncope With prodrome of dizziness. Likely multifactorial- from po oral intake, stress of son's illness, and bronchoitis/bronchiolitis as seen on CTA chest. EKG- unchanged T wave abnormalities in lead 3 and AVF. Troponin 16 > 34. CTA chest negative for PE. - EDP talked to cardiologist Dr. Diona Browner- recommended echo, possible stress cardiomyopathy, give fluids. - Repeat Troponin - Orthostatic vitals - 1 L bolus given, continue N/s + 40 Kcl @ 100cc/hr x 15hrs  Asthma, chronic Bronchitis/bronchiolitis on CT chest. Very faint/sparse expiratory wheezing.  No obvious dyspnea.  Mild dry cough.  O2 sats >94% on room air. -Albuterol nebs as needed  -Will need prescription for inhaler on discharge -Holding off on steroids for now  Pre-diabetes - Hgba1c  HTN (hypertension) Stable. HCTZ listed on med list. Per MAR she is not taking it.  -Hold, hydrate   DVT prophylaxis: Lovenox Code Status: FULL Family Communication: Spouse at bedside Disposition Plan: ~ 1 days Consults called: None  Admission status: Obs tele    Author: Onnie Boer, MD 07/03/2022 9:34 PM  For on call review [email or web address].

## 2022-07-03 NOTE — ED Notes (Signed)
Report given to Laurie Olsen.

## 2022-07-03 NOTE — Assessment & Plan Note (Addendum)
With prodrome of dizziness. Likely multifactorial- from po oral intake, stress of son's illness, and bronchoitis/bronchiolitis as seen on CTA chest. EKG- unchanged T wave abnormalities in lead 3 and AVF. Troponin 16 > 34. CTA chest negative for PE. - EDP talked to cardiologist Dr. Diona Browner- recommended echo, possible stress cardiomyopathy, give fluids. - Repeat Troponin - Orthostatic vitals - 1 L bolus given, continue N/s + 40 Kcl @ 100cc/hr x 15hrs

## 2022-07-03 NOTE — Assessment & Plan Note (Signed)
K- 3.1. Mag- 2.2 - Replete

## 2022-07-03 NOTE — ED Notes (Signed)
Patient via wheelchair to room A320

## 2022-07-03 NOTE — ED Provider Notes (Signed)
[place] EMERGENCY DEPARTMENT AT [facility] Provider Note   CSN: [id number] Arrival date & time: 07/03/22  1544     History  Chief Complaint  Patient presents with   Loss of Consciousness    Laurie Olsen is a 62 y.o. female.  HPI 62 year old female presents with syncope. She is the mother of a patient who is ill in the ED and was in the waiting room and passed out. Staff caught her, no injuries. She was unresponsive for about 30 seconds.  Currently, she is awake and the history is obtained with the help of the daughter-in-law who is here for her husband was able to provide some translation and history.  The patient currently feels somewhat dizzy though is not as bad as when she was in the waiting room.  She does state that she has been feeling poorly for the past 3 days with some generalized weakness, subjective fevers, and bilateral lower rib pain.  Denies a cough, headache, chest pain, shortness of breath, or abdominal pain.  No vomiting, diarrhea or urinary symptoms.  Home Medications Prior to Admission medications   Medication Sig Start Date End Date Taking? Authorizing Provider  hydrochlorothiazide (HYDRODIURIL) 25 MG tablet Take 25 mg by mouth daily. Patient not taking: Reported on 07/03/2022 01/14/22   [provider]  ibuprofen (ADVIL,MOTRIN) 600 MG tablet Take 1 tablet (600 mg total) by mouth 3 (three) times daily. Take with food Patient not taking: Reported on 07/03/2022 11/28/17   Pauline Aus, PA-C      Allergies    Patient has no known allergies.    Review of Systems   Review of Systems  Constitutional:  Positive for fever.  Respiratory:  Negative for cough and shortness of breath.   Gastrointestinal:  Negative for abdominal pain, diarrhea and vomiting.  Neurological:  Positive for syncope and light-headedness. Negative for headaches.    Physical Exam Updated Vital Signs BP (!) 152/93 (BP Location: Right Arm)   Pulse 87   Temp 99.8 F (37.7  C) (Oral)   Resp 20   Ht 5\' 3"  (1.6 m)   Wt 88.1 kg   SpO2 97%   BMI 34.41 kg/m  Physical Exam Vitals and nursing note reviewed.  Constitutional:      General: She is not in acute distress.    Appearance: She is well-developed. She is not ill-appearing or diaphoretic.  HENT:     Head: Normocephalic and atraumatic.  Eyes:     Pupils: Pupils are equal, round, and reactive to light.  Cardiovascular:     Rate and Rhythm: Normal rate and regular rhythm.     Heart sounds: Normal heart sounds.  Pulmonary:     Effort: Pulmonary effort is normal.     Breath sounds: Normal breath sounds.  Chest:     Chest wall: No tenderness (no tenderness over inferior ribs).  Abdominal:     General: There is no distension.     Palpations: Abdomen is soft.     Tenderness: There is no abdominal tenderness.  Musculoskeletal:     Cervical back: No rigidity.     Right lower leg: No edema.     Left lower leg: No edema.  Skin:    General: Skin is warm and dry.  Neurological:     Mental Status: She is alert.     Comments: Patient is awake and alert. Has equal strength in all 4 extremities. No facial droop.  ED Results / Procedures / Treatments   Labs (all labs ordered are listed, but only abnormal results are displayed) Labs Reviewed  COMPREHENSIVE METABOLIC PANEL - Abnormal; Notable for the following components:      Result Value   Sodium 132 (*)    Potassium 3.1 (*)    Chloride 97 (*)    Glucose, Bld 129 (*)    Creatinine, Ser 1.18 (*)    Calcium 8.7 (*)    AST 54 (*)    GFR, Estimated 52 (*)    All other components within normal limits  CBC WITH DIFFERENTIAL/PLATELET - Abnormal; Notable for the following components:   WBC 2.9 (*)    RDW 15.7 (*)    Neutro Abs 1.4 (*)    All other components within normal limits  URINALYSIS, W/ REFLEX TO CULTURE (INFECTION SUSPECTED) - Abnormal; Notable for the following components:   Bacteria, UA RARE (*)    All other components within normal  limits  LIPASE, BLOOD - Abnormal; Notable for the following components:   Lipase 57 (*)    All other components within normal limits  D-DIMER, QUANTITATIVE - Abnormal; Notable for the following components:   D-Dimer, Quant 0.82 (*)    All other components within normal limits  TROPONIN I (HIGH SENSITIVITY) - Abnormal; Notable for the following components:   Troponin I (High Sensitivity) 34 (*)    All other components within normal limits  TROPONIN I (HIGH SENSITIVITY) - Abnormal; Notable for the following components:   Troponin I (High Sensitivity) 22 (*)    All other components within normal limits  SARS CORONAVIRUS 2 BY RT PCR  MAGNESIUM  HIV ANTIBODY (ROUTINE TESTING W REFLEX)  BASIC METABOLIC PANEL  HEMOGLOBIN A1C  CBG MONITORING, ED  TROPONIN I (HIGH SENSITIVITY)  TROPONIN I (HIGH SENSITIVITY)    EKG EKG Interpretation  Date/Time:  Tuesday July 03 2022 15:47:19 EDT Ventricular Rate:  81 PR Interval:  139 QRS Duration: 83 QT Interval:  387 QTC Calculation: 450 R Axis:   33 Text Interpretation: Sinus rhythm Borderline T abnormalities, inferior leads nonspecific T waves similar to 2022 Confirmed by Pricilla Loveless [phone]) on 07/03/2022 3:58:45 PM  Radiology CT Angio Chest PE W and/or Wo Contrast  Result Date: 07/03/2022 CLINICAL DATA:  High probability for PE.  Fever. EXAM: CT ANGIOGRAPHY CHEST WITH CONTRAST TECHNIQUE: Multidetector CT imaging of the chest was performed using the standard protocol during bolus administration of intravenous contrast. Multiplanar CT image reconstructions and MIPs were obtained to evaluate the vascular anatomy. RADIATION DOSE REDUCTION: This exam was performed according to the departmental dose-optimization program which includes automated exposure control, adjustment of the mA and/or kV according to patient size and/or use of iterative reconstruction technique. CONTRAST:  75mL OMNIPAQUE IOHEXOL 350 MG/ML SOLN COMPARISON:  Chest x-ray same day  FINDINGS: Cardiovascular: Satisfactory opacification of the pulmonary arteries to the segmental level. No evidence of pulmonary embolism. Normal heart size. No pericardial effusion. Mediastinum/Nodes: There are prominent, but nonenlarged mediastinal and hilar lymph nodes diffusely. Esophagus and visualized thyroid gland are within normal limits. Lungs/Pleura: There are areas of air trapping diffusely in both lungs. There is no lung consolidation, pleural effusion or pneumothorax. There central peribronchial wall thickening. Trachea and central airways are patent. Upper Abdomen: No acute abnormality. Musculoskeletal: No chest wall abnormality. No acute or significant osseous findings. Review of the MIP images confirms the above findings. IMPRESSION: 1. No evidence for pulmonary embolism. 2. Central peribronchial wall thickening with areas of air  trapping diffusely in both lungs. Findings are compatible with bronchitis/bronchiolitis. 3. Prominent, but nonenlarged mediastinal and hilar lymph nodes are likely reactive. Electronically Signed   By: Darliss Cheney M.D.   On: 07/03/2022 17:38   DG Chest Portable 1 View  Result Date: 07/03/2022 CLINICAL DATA:  Syncope EXAM: PORTABLE CHEST 1 VIEW COMPARISON:  None Available. FINDINGS: The heart size and mediastinal contours are within normal limits. Low lung volumes accentuate pulmonary vascularity. Both lungs are clear. The visualized skeletal structures are unremarkable. IMPRESSION: No active disease. Electronically Signed   By: Minerva Fester M.D.   On: 07/03/2022 16:31    Procedures Procedures    Medications Ordered in ED Medications  enoxaparin (LOVENOX) injection 40 mg (40 mg Subcutaneous Given 07/03/22 2146)  0.9 % NaCl with KCl 40 mEq / L  infusion ( Intravenous New Bag/Given 07/03/22 2151)  acetaminophen (TYLENOL) tablet 650 mg (has no administration in time range)    Or  acetaminophen (TYLENOL) suppository 650 mg (has no administration in time range)   polyethylene glycol (MIRALAX / GLYCOLAX) packet 17 g (has no administration in time range)  HYDROcodone-acetaminophen (NORCO/VICODIN) 5-325 MG per tablet 1 tablet (1 tablet Oral Given 07/03/22 2156)  albuterol (PROVENTIL) (2.5 MG/3ML) 0.083% nebulizer solution 2.5 mg (has no administration in time range)  albuterol (PROVENTIL) (2.5 MG/3ML) 0.083% nebulizer solution 2.5 mg (has no administration in time range)  lactated ringers bolus 1,000 mL (0 mLs Intravenous Stopped 07/03/22 1802)  potassium chloride SA (KLOR-CON M) CR tablet 40 mEq (40 mEq Oral Given 07/03/22 1657)  iohexol (OMNIPAQUE) 350 MG/ML injection 75 mL (75 mLs Intravenous Contrast Given 07/03/22 1713)  aspirin chewable tablet 324 mg (324 mg Oral Given 07/03/22 1947)  potassium chloride SA (KLOR-CON M) CR tablet 40 mEq (40 mEq Oral Given 07/03/22 2146)    ED Course/ Medical Decision Making/ A&P                             Medical Decision Making Amount and/or Complexity of Data Reviewed Labs: ordered.    Details: Troponin went from 16 to 34 Mild bump in creatinine Radiology: ordered and independent interpretation performed.    Details: No PE ECG/medicine tests: ordered and independent interpretation performed.    Details: Nonspecific findings, no acute ischemia  Risk OTC drugs. Prescription drug management. Decision regarding hospitalization.   Patient presents with syncope in the lobby.  Her son is here with a critical illness and so this is presumably related.  However she is also been feeling poorly for the last few days, presumably a URI.  Workup ultimately involved a CTA to rule out PE after positive D-dimer.  This is negative.  Does show some bronchitis.  She also has a history of asthma.  However troponin went up from 16 up to 34.  Discussed with Dr. Diona Browner, could be a Takotsubo related illness given the stress and so he advises admission with echo and cardiology can follow.  No heparin for now.  Discussed with Dr. Mariea Clonts  for admission. Discussed results/plan with patient and family.        Final Clinical Impression(s) / ED Diagnoses Final diagnoses:  Syncope and collapse    Rx / DC Orders ED Discharge Orders     None         Pricilla Loveless, MD 07/03/22 2319

## 2022-07-03 NOTE — ED Triage Notes (Signed)
Pt was here to see son and had a syncopal episode in waiting room. Pt states she felt dizzy prior to passing out.

## 2022-07-03 NOTE — Assessment & Plan Note (Signed)
Hgba1c

## 2022-07-03 NOTE — ED Triage Notes (Signed)
Pt c/o being weak/not eating with fever and sweating x 3 days. Denies cough/n/v/d. Pt c/o pain to upper anterior rib area with movement. Pt in waiting room and had syncopal episode. Was brought back to room and edp at bedside. Pt a/o when in room. Diaphoretic.

## 2022-07-03 NOTE — Assessment & Plan Note (Signed)
Bronchitis/bronchiolitis on CT chest. Very faint/sparse expiratory wheezing.  No obvious dyspnea.  Mild dry cough.  O2 sats >94% on room air. -Albuterol nebs as needed  -Will need prescription for inhaler on discharge -Holding off on steroids for now

## 2022-07-03 NOTE — Assessment & Plan Note (Signed)
Stable. HCTZ listed on med list. Per MAR she is not taking it.  -Hold, hydrate

## 2022-07-04 ENCOUNTER — Observation Stay (HOSPITAL_BASED_OUTPATIENT_CLINIC_OR_DEPARTMENT_OTHER): Payer: Medicaid Other

## 2022-07-04 DIAGNOSIS — E876 Hypokalemia: Secondary | ICD-10-CM | POA: Diagnosis not present

## 2022-07-04 DIAGNOSIS — Z1152 Encounter for screening for COVID-19: Secondary | ICD-10-CM | POA: Diagnosis not present

## 2022-07-04 DIAGNOSIS — J209 Acute bronchitis, unspecified: Secondary | ICD-10-CM | POA: Diagnosis not present

## 2022-07-04 DIAGNOSIS — R55 Syncope and collapse: Secondary | ICD-10-CM | POA: Diagnosis not present

## 2022-07-04 DIAGNOSIS — R0781 Pleurodynia: Secondary | ICD-10-CM

## 2022-07-04 DIAGNOSIS — J42 Unspecified chronic bronchitis: Secondary | ICD-10-CM | POA: Diagnosis not present

## 2022-07-04 DIAGNOSIS — J45998 Other asthma: Secondary | ICD-10-CM | POA: Diagnosis not present

## 2022-07-04 LAB — BASIC METABOLIC PANEL
Anion gap: 9 (ref 5–15)
BUN: 10 mg/dL (ref 8–23)
CO2: 24 mmol/L (ref 22–32)
Calcium: 8 mg/dL — ABNORMAL LOW (ref 8.9–10.3)
Chloride: 104 mmol/L (ref 98–111)
Creatinine, Ser: 0.7 mg/dL (ref 0.44–1.00)
GFR, Estimated: >60 mL/min (ref >60)
Glucose, Bld: 153 mg/dL — ABNORMAL HIGH (ref 70–99)
Potassium: 3.9 mmol/L (ref 3.5–5.1)
Sodium: 137 mmol/L (ref 135–145)

## 2022-07-04 LAB — ECHOCARDIOGRAM COMPLETE
Area-P 1/2: 4.71 cm2
Calc EF: 60.6 %
Height: 63 in
S' Lateral: 3.1 cm
Single Plane A2C EF: 52.8 %
Single Plane A4C EF: 66.3 %
Weight: 3107.6 oz

## 2022-07-04 LAB — GLUCOSE, CAPILLARY: Glucose-Capillary: 122 mg/dL — ABNORMAL HIGH (ref 70–99)

## 2022-07-04 LAB — HEMOGLOBIN A1C
Hgb A1c MFr Bld: 6.5 % — ABNORMAL HIGH (ref 4.8–5.6)
Mean Plasma Glucose: 140 mg/dL

## 2022-07-04 LAB — HIV ANTIBODY (ROUTINE TESTING W REFLEX): HIV Screen 4th Generation wRfx: NONREACTIVE

## 2022-07-04 MED ORDER — METHYLPREDNISOLONE SODIUM SUCC 125 MG IJ SOLR
125.0000 mg | Freq: Once | INTRAMUSCULAR | Status: AC
  Administered 2022-07-04: 125 mg via INTRAVENOUS
  Filled 2022-07-04: qty 2

## 2022-07-04 MED ORDER — PREDNISONE 20 MG PO TABS: 40.0000 mg | ORAL_TABLET | Freq: Every day | ORAL | 0 refills | Status: AC

## 2022-07-04 MED ORDER — DEXTROMETHORPHAN POLISTIREX ER 30 MG/5ML PO SUER: 30.0000 mg | Freq: Two times a day (BID) | ORAL | 0 refills | Status: AC | PRN

## 2022-07-04 MED ORDER — ALBUTEROL SULFATE HFA 108 (90 BASE) MCG/ACT IN AERS: 2.0000 | INHALATION_SPRAY | RESPIRATORY_TRACT | 1 refills | Status: AC | PRN

## 2022-07-04 MED ORDER — FAMOTIDINE 20 MG PO TABS
20.0000 mg | ORAL_TABLET | Freq: Every day | ORAL | Status: DC
  Administered 2022-07-04: 20 mg via ORAL
  Filled 2022-07-04: qty 1

## 2022-07-04 MED ORDER — IPRATROPIUM-ALBUTEROL 0.5-2.5 (3) MG/3ML IN SOLN
3.0000 mL | RESPIRATORY_TRACT | Status: DC
  Administered 2022-07-04: 3 mL via RESPIRATORY_TRACT
  Filled 2022-07-04: qty 3

## 2022-07-04 MED ORDER — KETOROLAC TROMETHAMINE 30 MG/ML IJ SOLN
30.0000 mg | Freq: Four times a day (QID) | INTRAMUSCULAR | Status: DC | PRN
  Administered 2022-07-04: 30 mg via INTRAVENOUS
  Filled 2022-07-04: qty 1

## 2022-07-04 MED ORDER — POTASSIUM CHLORIDE IN NACL 40-0.9 MEQ/L-% IV SOLN: INTRAVENOUS | Status: DC

## 2022-07-04 MED ORDER — ALUM & MAG HYDROXIDE-SIMETH 200-200-20 MG/5ML PO SUSP
30.0000 mL | Freq: Once | ORAL | Status: AC
  Administered 2022-07-04: 30 mL via ORAL
  Filled 2022-07-04: qty 30

## 2022-07-04 MED ORDER — DOXYCYCLINE HYCLATE 100 MG PO CAPS: 100.0000 mg | ORAL_CAPSULE | Freq: Two times a day (BID) | ORAL | 0 refills | Status: AC

## 2022-07-04 MED ORDER — HYDROCODONE-ACETAMINOPHEN 5-325 MG PO TABS: 1.0000 | ORAL_TABLET | Freq: Four times a day (QID) | ORAL | Status: DC | PRN

## 2022-07-04 NOTE — Consult Note (Signed)
Cardiology Consultation   Patient ID: Laurie Olsen MRN: [id number]; DOB: 1960/04/19  Admit date: 07/03/2022 Date of Consult: 07/04/2022  PCP:  [facility] Providers Cardiologist:  New to [facility]  Patient Profile:   Laurie Olsen is a 62 y.o. female with a hx of HTN, HLD, arthritis, prediabetes and atypical ductal hyperplasia (s/p right lumpectomy) who is being seen 07/04/2022 for the evaluation of chest pain and syncope at the request of Dr. Laural Benes.  History of Present Illness:   Laurie Olsen was at [facility] ED on 07/03/2022 with her son when she was noted to have a syncopal episode while in the waiting room. She reported minimal food intake over the past 3 days along with intermittent fever and diaphoresis.   Initial labs showed WBC 2.9, Hgb 12.5, platelets 222, Na+ 132, K+ 3.1 and creatinine 1.18 (previously at 0.81 in 2023). Lipase elevated at 57. D-dimer elevated at 0.82. Negative for COVID and influenza. Initial and repeat Hs Troponin values mildly elevated at 16, 34, 22 and 16. CXR with no active disease. CTA showed no evidence of a PE but was noted to have central peribronchial wall thickening with areas of air trapping diffusely in both lungs which was compatible with bronchitis/bronchiolitis. Was also noted to have prominent mediastinal and hilar lymph nodes which are likely reactive. EKG showed normal sinus rhythm, rate 81 with TWI along the inferior leads which is similar to prior tracings.  She was admitted for further management as her syncope was felt to be secondary to dehydration in the setting of poor oral intake and stress secondary to her son's illness. She was on HCTZ prior to admission but was listed as not taking this routinely. This was stopped and she was started on IV fluids. Overnight around 0200, she reported having palpitations and discomfort in her chest. Repeat EKG showed no acute changes.   In talking with the patient today  (use interpreter services Laurie Olsen - ID # [phone]), she reports she was in the ED yesterday as one of her children was brought to the ED for asthma. She had a syncopal episode at that time and is unaware of any prodromal symptoms. Overnight, she reports having some chest pain but feels like this was due to her asthma as it was worse with coughing. Reports pain along her chest with turning from side-to-side or lifting her arms.  Pain does not occur with exertion. She does have baseline dyspnea on exertion in the setting of asthma. No specific orthopnea, PND or pitting edema. She is unaware of any personal history of CAD or CHF. Reports lots of family numbers have "heart issues" but specifics not available. Reports she had not consumed food for 5 days leading up to admission and she did not have an appetite. Feels like this is starting to improve. She denies any current symptoms of anything at this time and is anxious to go home.  Past Medical History:  Diagnosis Date   Arthritis    Asthma    Chronic pain    Depression    High cholesterol    Hypertension    Pre-diabetes     Past Surgical History:  Procedure Laterality Date   BREAST LUMPECTOMY WITH RADIOACTIVE SEED LOCALIZATION Right 12/02/2020   Procedure: RIGHT BREAST LUMPECTOMY WITH RADIOACTIVE SEED LOCALIZATION;  Surgeon: Griselda Miner, MD;  Location: [facility];  Service: General;  Laterality: Right;   TUBAL LIGATION  Home Medications:  Prior to Admission medications   Medication Sig Start Date End Date Taking? Authorizing Provider  hydrochlorothiazide (HYDRODIURIL) 25 MG tablet Take 25 mg by mouth daily. Patient not taking: Reported on 07/03/2022 01/14/22   [provider]  ibuprofen (ADVIL,MOTRIN) 600 MG tablet Take 1 tablet (600 mg total) by mouth 3 (three) times daily. Take with food Patient not taking: Reported on 07/03/2022 11/28/17   Pauline Aus, PA-C    Inpatient Medications: Scheduled Meds:  alum &  mag hydroxide-simeth  30 mL Oral Once   enoxaparin (LOVENOX) injection  40 mg Subcutaneous Q24H   famotidine  20 mg Oral Daily   Continuous Infusions:  0.9 % NaCl with KCl 40 mEq / L 75 mL/hr at 07/04/22 0856   PRN Meds: acetaminophen **OR** acetaminophen, albuterol, HYDROcodone-acetaminophen, ketorolac, polyethylene glycol  Allergies:   No Known Allergies  Social History:   Social History   Socioeconomic History   Marital status: Single    Spouse name: Not on file   Number of children: 6   Years of education: Not on file   Highest education level: 8th grade  Occupational History   Not on file  Tobacco Use   Smoking status: Former    Types: Cigarettes    Quit date: 1980    Years since quitting: 44.4   Smokeless tobacco: Never  Vaping Use   Vaping Use: Never used  Substance and Sexual Activity   Alcohol use: Not Currently   Drug use: Never   Sexual activity: Yes    Birth control/protection: Post-menopausal  Other Topics Concern   Not on file  Social History Narrative   Not on file   Social Determinants of Health   Financial Resource Strain: Not on file  Food Insecurity: Food Insecurity Present (07/03/2022)   Hunger Vital Sign    Worried About Programme researcher, broadcasting/film/video in the Last Year: Often true    Ran Out of Food in the Last Year: Sometimes true  Transportation Needs: Unmet Transportation Needs (07/03/2022)   PRAPARE - Administrator, Civil Service (Medical): Yes    Lack of Transportation (Non-Medical): Yes  Physical Activity: Not on file  Stress: Not on file  Social Connections: Not on file  Intimate Partner Violence: Not on file    Family History:    Family History  Problem Relation Age of Onset   Heart disease Mother    Heart disease Father    Heart disease Brother      ROS:  Please see the history of present illness.   All other ROS reviewed and negative.     Physical Exam/Data:   Vitals:   07/04/22 0137 07/04/22 0435 07/04/22 0612  07/04/22 0801  BP: 123/77 116/63 118/68   Pulse: 88 78 76   Resp: 16 20 20    Temp: 98.4 F (36.9 C) 98.2 F (36.8 C) 98.2 F (36.8 C)   TempSrc: Oral Oral Oral   SpO2: 93% 97% 96% 98%  Weight:      Height:        Intake/Output Summary (Last 24 hours) at 07/04/2022 0939 Last data filed at 07/04/2022 0316 Gross per 24 hour  Intake 534.71 ml  Output --  Net 534.71 ml      07/03/2022    9:21 PM 07/03/2022    3:45 PM 12/02/2020    8:47 AM  Last 3 Weights  Weight (lbs) 194 lb 3.6 oz 199 lb 8.3 oz 199 lb 8.3 oz  Weight (kg) 88.1 kg 90.5 kg 90.5 kg     Body mass index is 34.41 kg/m.  General:  Well nourished, well developed female appearing  in no acute distress HEENT: normal Neck: no JVD Vascular: No carotid bruits; Distal pulses 2+ bilaterally Cardiac:  normal S1, S2; RRR; no murmur  Lungs: Expiratory wheezing along upper lung fields bilaterally. Abd: soft, nontender, no hepatomegaly  Ext: no pitting edema Musculoskeletal:  No deformities, BUE and BLE strength normal and equal Skin: warm and dry  Neuro:  CNs 2-12 intact, no focal abnormalities noted Psych:  Normal affect   EKG:  The EKG was personally reviewed and demonstrates: Normal sinus rhythm, rate 81 with TWI along the inferior leads which is similar to prior tracings. Telemetry:  Telemetry was personally reviewed and demonstrates: NSR, HR in 70's to 80's.   Relevant CV Studies:  Echocardiogram: Pending  Laboratory Data:  High Sensitivity Troponin:   Recent Labs  Lab 07/03/22 1550 07/03/22 1744 07/03/22 2041 07/03/22 2318  TROPONINIHS 16 34* 22* 16     Chemistry Recent Labs  Lab 07/03/22 1550 07/03/22 2041 07/04/22 0422  NA 132*  --  137  K 3.1*  --  3.9  CL 97*  --  104  CO2 22  --  24  GLUCOSE 129*  --  153*  BUN 17  --  10  CREATININE 1.18*  --  0.70  CALCIUM 8.7*  --  8.0*  MG  --  2.2  --   GFRNONAA 52*  --  >60  ANIONGAP 13  --  9    Recent Labs  Lab 07/03/22 1550  PROT 8.0  ALBUMIN  3.9  AST 54*  ALT 42  ALKPHOS 76  BILITOT 0.7   Lipids No results for input(s): "CHOL", "TRIG", "HDL", "LABVLDL", "LDLCALC", "CHOLHDL" in the last 168 hours.  Hematology Recent Labs  Lab 07/03/22 1550  WBC 2.9*  RBC 4.72  HGB 12.5  HCT 38.5  MCV 81.6  MCH 26.5  MCHC 32.5  RDW 15.7*  PLT 222   Thyroid No results for input(s): "TSH", "FREET4" in the last 168 hours.  BNPNo results for input(s): "BNP", "PROBNP" in the last 168 hours.  DDimer  Recent Labs  Lab 07/03/22 1550  DDIMER 0.82*     Radiology/Studies:  CT Angio Chest PE W and/or Wo Contrast  Result Date: 07/03/2022 CLINICAL DATA:  High probability for PE.  Fever. EXAM: CT ANGIOGRAPHY CHEST WITH CONTRAST TECHNIQUE: Multidetector CT imaging of the chest was performed using the standard protocol during bolus administration of intravenous contrast. Multiplanar CT image reconstructions and MIPs were obtained to evaluate the vascular anatomy. RADIATION DOSE REDUCTION: This exam was performed according to the departmental dose-optimization program which includes automated exposure control, adjustment of the mA and/or kV according to patient size and/or use of iterative reconstruction technique. CONTRAST:  75mL OMNIPAQUE IOHEXOL 350 MG/ML SOLN COMPARISON:  Chest x-ray same day FINDINGS: Cardiovascular: Satisfactory opacification of the pulmonary arteries to the segmental level. No evidence of pulmonary embolism. Normal heart size. No pericardial effusion. Mediastinum/Nodes: There are prominent, but nonenlarged mediastinal and hilar lymph nodes diffusely. Esophagus and visualized thyroid gland are within normal limits. Lungs/Pleura: There are areas of air trapping diffusely in both lungs. There is no lung consolidation, pleural effusion or pneumothorax. There central peribronchial wall thickening. Trachea and central airways are patent. Upper Abdomen: No acute abnormality. Musculoskeletal: No chest wall abnormality. No acute or  significant osseous findings. Review of the MIP  images confirms the above findings. IMPRESSION: 1. No evidence for pulmonary embolism. 2. Central peribronchial wall thickening with areas of air trapping diffusely in both lungs. Findings are compatible with bronchitis/bronchiolitis. 3. Prominent, but nonenlarged mediastinal and hilar lymph nodes are likely reactive. Electronically Signed   By: Darliss Cheney M.D.   On: 07/03/2022 17:38   DG Chest Portable 1 View  Result Date: 07/03/2022 CLINICAL DATA:  Syncope EXAM: PORTABLE CHEST 1 VIEW COMPARISON:  None Available. FINDINGS: The heart size and mediastinal contours are within normal limits. Low lung volumes accentuate pulmonary vascularity. Both lungs are clear. The visualized skeletal structures are unremarkable. IMPRESSION: No active disease. Electronically Signed   By: Minerva Fester M.D.   On: 07/03/2022 16:31     Assessment and Plan:   1. Syncope - Unclear etiology but this occurred in the setting of the patient having not consumed food for 5 days per her report. Creatinine was at 1.18 at the time admission and improved to 0.70 today following administration of IV fluids. She feels back to baseline at this time. Telemetry has been reassuring with no significant arrhythmias. Echocardiogram is pending to assess for any structural abnormalities. - At this time, suspect her syncopal episode was secondary to dehydration. If she has recurrent events, could consider a monitor at that time but her overall episode seems atypical for a cardiac etiology.  2. Elevated Troponin Values/Atypical Chest Pain - Hs Troponin values were at 16, 34, 22 and 16. EKG does show T wave inversion along inferior leads which is similar to prior tracings. CTA without an acute PE. - Her chest pain overall seems most consistent with pleuritic chest pain as it is worse with coughing and perhaps a musculoskeletal component as well as this occurs with movement of her arms. No  association with exertion. Echocardiogram is pending to assess for any structural abnormalities. If this is reassuring, could consider additional workup with a Coronary CTA as an outpatient. Would avoid Lexiscan given her current wheezing on examination.  3. HTN - BP has been stable, at 118/68 on most recent check. PTA HCTZ currently held given concerns for dehydration on admission and she was listed as not taking this.  4. HLD - Listed in her past medical history but no recent FLP available for review and not currently on statin therapy. Can check as an outpatient if discharged today.   5. Chronic Bronchitis - CTA this admission showed central peribronchial wall thickening with areas of air trapping diffusely in both lungs which was compatible with bronchitis/bronchiolitis. PRN nebulizer treatments ordered. Management per the admitting team.     For questions or updates, please contact [facility] Please consult [email or web address] for contact info under    Signed, Ellsworth Lennox, PA-C  07/04/2022 9:39 AM

## 2022-07-04 NOTE — Discharge Summary (Addendum)
Physician Discharge Summary  Laurie Olsen WUJ:[id number] DOB: 1960/08/10 DOA: 07/03/2022  PCP: [facility] Patient [facility]  Admit date: 07/03/2022 Discharge date: 07/04/2022  Admitted From:  Home  Disposition: Home   Recommendations for Outpatient Follow-up:  Follow up with PCP in 1-2 weeks  Home Health: n/a   Discharge Condition: STABLE   CODE STATUS: FULL DIET: Regular    Brief Hospitalization Summary: Please see all hospital notes, images, labs for full details of the hospitalization. ADMISSION PROVIDER HPI:  62 y.o. female with medical history significant for asthma, depression, hypertension, prediabetes.  Patient is Spanish-speaking, she is able to communicate in little but sufficient English to give me history.  Husband is at bedside, patient was in the waiting room when she passed out.  Her son (in his 58s) is ill and was transferred to [facility], for abdominal pathology- she is unable to tell me more details. She has been feeling sick over the past 3 days, with poor oral intake over the past 3 days due to the poor appetite.  No vomiting no loose stools.  No urinary symptoms.  She reports bilateral lower rib chest pain that hurts with any degree of movement at all.  She reports difficulty breathing over the past few days for which she used her asthma inhaler but she has run out.  She reports minimal nonproductive cough.  Prior to passing out she felt dizzy.  Per ED staff she was out for about 30 seconds. When she came to, she was diaphoretic.   ED Course: Patient 98.4.  Heart rate 70s to 80s.  Respiratory rate 14-19.  Blood pressure systolic 120s to 956O.  O2 sat greater than 94% on room air.  Troponin 16 > 34.  D-dimer 0.8.  Potassium 3.1, sodium 132.  UA with rare bacteria.   CTA chest negative for PE, findings compatible with bronchitis/bronchiolitis.  1 L bolus Given.  EDP cardiologist Dr. Cindra Eves, recommended echo, possible stress cardiomyopathy, give fluids.  HOSPITAL COURSE   Patient was admitted for having a syncopal episode that was thought secondary to dehydration and poor oral intake over the last several days prior to arrival.  She was treated with IV fluid hydration with good results and is feeling better ambulated with PT with no difficulties.  She also complained of atypical pleuritic-like chest pain symptoms associated with acute bronchitis.  She was evaluated by inpatient cardiology and signed off as they felt that this was more of a pleuritic type chest pain and not cardiac.  She has been treated for acute bronchitis.  Her hypokalemia was repleted.  She is stable to discharge home follow-up outpatient with her primary care provider.  All communication with patient provided through a qualified MEDICAL Spanish language interpreter.    Discharge Diagnoses:  Principal Problem:   Syncope Active Problems:   Asthma, chronic   HTN (hypertension)   Pre-diabetes   Hypokalemia   Discharge Instructions:  Allergies as of 07/04/2022   No Known Allergies      Medication List     STOP taking these medications    hydrochlorothiazide 25 MG tablet Commonly known as: HYDRODIURIL   ibuprofen 600 MG tablet Commonly known as: ADVIL       TAKE these medications    albuterol 108 (90 Base) MCG/ACT inhaler Commonly known as: VENTOLIN HFA Inhale 2 puffs into the lungs every 4 (four) hours as needed for wheezing or shortness of breath (cough, shortness of breath or wheezing.).   dextromethorphan 30 MG/5ML liquid  Commonly known as: Delsym Take 5 mLs (30 mg total) by mouth 2 (two) times daily as needed for cough.   doxycycline 100 MG capsule Commonly known as: VIBRAMYCIN Take 1 capsule (100 mg total) by mouth 2 (two) times daily for 3 days.   predniSONE 20 MG tablet Commonly known as: DELTASONE Take 2 tablets (40 mg total) by mouth daily with breakfast for 4 days. Start taking on: July 05, 2022        Follow-up Information     [facility], [facility] Follow up in 1 week(s).   Why: Hospital Follow Up Contact information: [address] [place] [address] [phone]                No Known Allergies Allergies as of 07/04/2022   No Known Allergies      Medication List     STOP taking these medications    hydrochlorothiazide 25 MG tablet Commonly known as: HYDRODIURIL   ibuprofen 600 MG tablet Commonly known as: ADVIL       TAKE these medications    albuterol 108 (90 Base) MCG/ACT inhaler Commonly known as: VENTOLIN HFA Inhale 2 puffs into the lungs every 4 (four) hours as needed for wheezing or shortness of breath (cough, shortness of breath or wheezing.).   dextromethorphan 30 MG/5ML liquid Commonly known as: Delsym Take 5 mLs (30 mg total) by mouth 2 (two) times daily as needed for cough.   doxycycline 100 MG capsule Commonly known as: VIBRAMYCIN Take 1 capsule (100 mg total) by mouth 2 (two) times daily for 3 days.   predniSONE 20 MG tablet Commonly known as: DELTASONE Take 2 tablets (40 mg total) by mouth daily with breakfast for 4 days. Start taking on: July 05, 2022        Procedures/Studies: ECHOCARDIOGRAM COMPLETE  Result Date: 07/04/2022    ECHOCARDIOGRAM REPORT   Patient Name:   Laurie Olsen Date of Exam: 07/04/2022 Medical Rec #:  [id number]    Height:       63.0 in Accession #:    [id number]   Weight:       194.2 lb Date of Birth:  09-19-1960    BSA:          1.910 m Patient Age:    61 years     BP:           118/68 mmHg Patient Gender: F            HR:           75 bpm. [facility] Procedure: 2D Echo, Cardiac Doppler and Color Doppler Indications:    Syncope R55  History:        Patient has no prior history of Echocardiogram examinations.                 Signs/Symptoms:Syncope; Risk Factors:Former Smoker, Hypertension                 and Dyslipidemia.  Sonographer:    Aron Baba Referring Phys: [id number] Onnie Boer  Sonographer Comments: Image acquisition  challenging due to respiratory motion. IMPRESSIONS  1. Left ventricular ejection fraction, by estimation, is 50 to 55%. The left ventricle has low normal function. Left ventricular endocardial border not optimally defined to evaluate regional wall motion. Left ventricular diastolic parameters were normal.  2. Right ventricular systolic function is low normal. The right ventricular size is normal. Tricuspid regurgitation signal is inadequate for  assessing PA pressure.  3. The mitral valve was not well visualized. Trivial mitral valve regurgitation. No evidence of mitral stenosis.  4. The aortic valve has an indeterminant number of cusps. Aortic valve regurgitation is not visualized. No aortic stenosis is present.  5. The inferior vena cava is normal in size but collapsibility is not well visualized. Comparison(s): No prior Echocardiogram. FINDINGS  Left Ventricle: Left ventricular ejection fraction, by estimation, is 50 to 55%. The left ventricle has low normal function. Left ventricular endocardial border not optimally defined to evaluate regional wall motion. The left ventricular internal cavity  size was normal in size. There is no left ventricular hypertrophy. Left ventricular diastolic parameters were normal. Right Ventricle: The right ventricular size is normal. No increase in right ventricular wall thickness. Right ventricular systolic function is low normal. Tricuspid regurgitation signal is inadequate for assessing PA pressure. Left Atrium: Left atrial size was normal in size. Right Atrium: Right atrial size was normal in size. Pericardium: There is no evidence of pericardial effusion. Mitral Valve: The mitral valve was not well visualized. Trivial mitral valve regurgitation. No evidence of mitral valve stenosis. Tricuspid Valve: The tricuspid valve is not well visualized. Tricuspid valve regurgitation is not demonstrated. No evidence of tricuspid stenosis. Aortic Valve: The aortic valve has an indeterminant  number of cusps. Aortic valve regurgitation is not visualized. No aortic stenosis is present. Pulmonic Valve: The pulmonic valve was not well visualized. Pulmonic valve regurgitation is not visualized. No evidence of pulmonic stenosis. Aorta: The aortic root and ascending aorta are structurally normal, with no evidence of dilitation. Venous: The inferior vena cava is normal in size but collapsibility is not well visualized. IAS/Shunts: The interatrial septum was not well visualized.  LEFT VENTRICLE PLAX 2D LVIDd:         4.20 cm      Diastology LVIDs:         3.10 cm      LV e' medial:    7.51 cm/s LV PW:         0.70 cm      LV E/e' medial:  11.6 LV IVS:        0.80 cm      LV e' lateral:   10.40 cm/s LVOT diam:     1.70 cm      LV E/e' lateral: 8.4 LV SV:         42 LV SV Index:   22 LVOT Area:     2.27 cm  LV Volumes (MOD) LV vol d, MOD A2C: 107.0 ml LV vol d, MOD A4C: 109.0 ml LV vol s, MOD A2C: 50.5 ml LV vol s, MOD A4C: 36.7 ml LV SV MOD A2C:     56.5 ml LV SV MOD A4C:     109.0 ml LV SV MOD BP:      66.5 ml RIGHT VENTRICLE RV S prime:     13.30 cm/s TAPSE (M-mode): 1.9 cm LEFT ATRIUM             Index        RIGHT ATRIUM           Index LA diam:        3.50 cm 1.83 cm/m   RA Area:     16.20 cm LA Vol (A2C):   36.7 ml 19.22 ml/m  RA Volume:   39.30 ml  20.58 ml/m LA Vol (A4C):   65.5 ml 34.30 ml/m LA Biplane Vol: 50.7 ml 26.55 ml/m  AORTIC VALVE LVOT Vmax:   84.40 cm/s LVOT Vmean:  55.300 cm/s LVOT VTI:    0.187 m  AORTA Ao Root diam: 2.90 cm Ao Asc diam:  3.50 cm MITRAL VALVE MV Area (PHT): 4.71 cm    SHUNTS MV Decel Time: 161 msec    Systemic VTI:  0.19 m MV E velocity: 87.30 cm/s  Systemic Diam: 1.70 cm MV A velocity: 91.20 cm/s MV E/A ratio:  0.96 Vishnu Priya Mallipeddi Electronically signed by Winfield Rast Mallipeddi Signature Date/Time: 07/04/2022/1:55:50 PM    Final    CT Angio Chest PE W and/or Wo Contrast  Result Date: 07/03/2022 CLINICAL DATA:  High probability for PE.  Fever. EXAM: CT  ANGIOGRAPHY CHEST WITH CONTRAST TECHNIQUE: Multidetector CT imaging of the chest was performed using the standard protocol during bolus administration of intravenous contrast. Multiplanar CT image reconstructions and MIPs were obtained to evaluate the vascular anatomy. RADIATION DOSE REDUCTION: This exam was performed according to the departmental dose-optimization program which includes automated exposure control, adjustment of the mA and/or kV according to patient size and/or use of iterative reconstruction technique. CONTRAST:  75mL OMNIPAQUE IOHEXOL 350 MG/ML SOLN COMPARISON:  Chest x-ray same day FINDINGS: Cardiovascular: Satisfactory opacification of the pulmonary arteries to the segmental level. No evidence of pulmonary embolism. Normal heart size. No pericardial effusion. Mediastinum/Nodes: There are prominent, but nonenlarged mediastinal and hilar lymph nodes diffusely. Esophagus and visualized thyroid gland are within normal limits. Lungs/Pleura: There are areas of air trapping diffusely in both lungs. There is no lung consolidation, pleural effusion or pneumothorax. There central peribronchial wall thickening. Trachea and central airways are patent. Upper Abdomen: No acute abnormality. Musculoskeletal: No chest wall abnormality. No acute or significant osseous findings. Review of the MIP images confirms the above findings. IMPRESSION: 1. No evidence for pulmonary embolism. 2. Central peribronchial wall thickening with areas of air trapping diffusely in both lungs. Findings are compatible with bronchitis/bronchiolitis. 3. Prominent, but nonenlarged mediastinal and hilar lymph nodes are likely reactive. Electronically Signed   By: Darliss Cheney M.D.   On: 07/03/2022 17:38   DG Chest Portable 1 View  Result Date: 07/03/2022 CLINICAL DATA:  Syncope EXAM: PORTABLE CHEST 1 VIEW COMPARISON:  None Available. FINDINGS: The heart size and mediastinal contours are within normal limits. Low lung volumes accentuate  pulmonary vascularity. Both lungs are clear. The visualized skeletal structures are unremarkable. IMPRESSION: No active disease. Electronically Signed   By: Minerva Fester M.D.   On: 07/03/2022 16:31     Subjective: Pt having CP only when taking deep breath or making certain movements, associated with cough and chest congestion.    Discharge Exam: Vitals:   07/04/22 1318 07/04/22 1452  BP:  (!) 123/59  Pulse:  80  Resp:  18  Temp:  99.1 F (37.3 C)  SpO2: 98% 95%   Vitals:   07/04/22 0612 07/04/22 0801 07/04/22 1318 07/04/22 1452  BP: 118/68   (!) 123/59  Pulse: 76   80  Resp: 20   18  Temp: 98.2 F (36.8 C)   99.1 F (37.3 C)  TempSrc: Oral   Oral  SpO2: 96% 98% 98% 95%  Weight:      Height:        General: Pt is alert, awake, not in acute distress Cardiovascular: RRR, S1/S2 +, no rubs, no gallops Respiratory: expiratory wheezing, no rhonchi Abdominal: Soft, NT, ND, bowel sounds + Extremities: no edema, no cyanosis   The results of significant diagnostics from this  hospitalization (including imaging, microbiology, ancillary and laboratory) are listed below for reference.     Microbiology: Recent Results (from the past 240 hour(s))  SARS Coronavirus 2 by RT PCR (hospital order, performed in [facility] hospital lab) *cepheid single result test* Anterior Nasal Swab     Status: None   Collection Time: 07/03/22  3:51 PM   Specimen: Anterior Nasal Swab  Result Value Ref Range Status   SARS Coronavirus 2 by RT PCR NEGATIVE NEGATIVE Final    Comment: (NOTE) SARS-CoV-2 target nucleic acids are NOT DETECTED.  The SARS-CoV-2 RNA is generally detectable in upper and lower respiratory specimens during the acute phase of infection. The lowest concentration of SARS-CoV-2 viral copies this assay can detect is 250 copies / mL. A negative result does not preclude SARS-CoV-2 infection and should not be used as the sole basis for treatment or other patient management decisions.  A  negative result may occur with improper specimen collection / handling, submission of specimen other than nasopharyngeal swab, presence of viral mutation(s) within the areas targeted by this assay, and inadequate number of viral copies (<250 copies / mL). A negative result must be combined with clinical observations, patient history, and epidemiological information.  Fact Sheet for Patients:   RoadLapTop.co.za  Fact Sheet for Healthcare Providers: http://kim-miller.com/  This test is not yet approved or  cleared by the [place] FDA and has been authorized for detection and/or diagnosis of SARS-CoV-2 by FDA under an Emergency Use Authorization (EUA).  This EUA will remain in effect (meaning this test can be used) for the duration of the COVID-19 declaration under Section 564(b)(1) of the Act, 21 U.S.C. section 360bbb-3(b)(1), unless the authorization is terminated or revoked sooner.  Performed at [facility], [address], [place], [place] [address]      Labs: BNP (last 3 results) No results for input(s): "BNP" in the last 8760 hours. Basic Metabolic Panel: Recent Labs  Lab 07/03/22 1550 07/03/22 2041 07/04/22 0422  NA 132*  --  137  K 3.1*  --  3.9  CL 97*  --  104  CO2 22  --  24  GLUCOSE 129*  --  153*  BUN 17  --  10  CREATININE 1.18*  --  0.70  CALCIUM 8.7*  --  8.0*  MG  --  2.2  --    Liver Function Tests: Recent Labs  Lab 07/03/22 1550  AST 54*  ALT 42  ALKPHOS 76  BILITOT 0.7  PROT 8.0  ALBUMIN 3.9   Recent Labs  Lab 07/03/22 1550  LIPASE 57*   No results for input(s): "AMMONIA" in the last 168 hours. CBC: Recent Labs  Lab 07/03/22 1550  WBC 2.9*  NEUTROABS 1.4*  HGB 12.5  HCT 38.5  MCV 81.6  PLT 222   Cardiac Enzymes: No results for input(s): "CKTOTAL", "CKMB", "CKMBINDEX", "TROPONINI" in the last 168 hours. BNP: Invalid input(s): "POCBNP" CBG: Recent Labs  Lab 07/03/22 1541   GLUCAP 122*   D-Dimer Recent Labs    07/03/22 1550  DDIMER 0.82*   Hgb A1c No results for input(s): "HGBA1C" in the last 72 hours. Lipid Profile No results for input(s): "CHOL", "HDL", "LDLCALC", "TRIG", "CHOLHDL", "LDLDIRECT" in the last 72 hours. Thyroid function studies No results for input(s): "TSH", "T4TOTAL", "T3FREE", "THYROIDAB" in the last 72 hours.  Invalid input(s): "FREET3" Anemia work up No results for input(s): "VITAMINB12", "FOLATE", "FERRITIN", "TIBC", "IRON", "RETICCTPCT" in the last 72 hours. Urinalysis  Component Value Date/Time   COLORURINE YELLOW 07/03/2022 1718   APPEARANCEUR CLEAR 07/03/2022 1718   LABSPEC 1.021 07/03/2022 1718   PHURINE 6.0 07/03/2022 1718   GLUCOSEU NEGATIVE 07/03/2022 1718   HGBUR NEGATIVE 07/03/2022 1718   BILIRUBINUR NEGATIVE 07/03/2022 1718   KETONESUR NEGATIVE 07/03/2022 1718   PROTEINUR NEGATIVE 07/03/2022 1718   NITRITE NEGATIVE 07/03/2022 1718   LEUKOCYTESUR NEGATIVE 07/03/2022 1718   Sepsis Labs Recent Labs  Lab 07/03/22 1550  WBC 2.9*   Microbiology Recent Results (from the past 240 hour(s))  SARS Coronavirus 2 by RT PCR (hospital order, performed in [facility] hospital lab) *cepheid single result test* Anterior Nasal Swab     Status: None   Collection Time: 07/03/22  3:51 PM   Specimen: Anterior Nasal Swab  Result Value Ref Range Status   SARS Coronavirus 2 by RT PCR NEGATIVE NEGATIVE Final    Comment: (NOTE) SARS-CoV-2 target nucleic acids are NOT DETECTED.  The SARS-CoV-2 RNA is generally detectable in upper and lower respiratory specimens during the acute phase of infection. The lowest concentration of SARS-CoV-2 viral copies this assay can detect is 250 copies / mL. A negative result does not preclude SARS-CoV-2 infection and should not be used as the sole basis for treatment or other patient management decisions.  A negative result may occur with improper specimen collection / handling, submission of  specimen other than nasopharyngeal swab, presence of viral mutation(s) within the areas targeted by this assay, and inadequate number of viral copies (<250 copies / mL). A negative result must be combined with clinical observations, patient history, and epidemiological information.  Fact Sheet for Patients:   RoadLapTop.co.za  Fact Sheet for Healthcare Providers: http://kim-miller.com/  This test is not yet approved or  cleared by the [place] FDA and has been authorized for detection and/or diagnosis of SARS-CoV-2 by FDA under an Emergency Use Authorization (EUA).  This EUA will remain in effect (meaning this test can be used) for the duration of the COVID-19 declaration under Section 564(b)(1) of the Act, 21 U.S.C. section 360bbb-3(b)(1), unless the authorization is terminated or revoked sooner.  Performed at [facility], [address], [place], [place] [address]     Time coordinating discharge:   SIGNED:  Standley Dakins, MD  [facility] 07/04/2022, 4:43 PM How to contact the [facility] Attending or Consulting provider 7A - 7P or covering provider during after hours 7P -7A, for this patient?  Check the care team in [facility] and look for a) attending/consulting TRH provider listed and b) the [facility] team listed Log into [email or web address] and use [facility] universal password to access. If you do not have the password, please contact the hospital operator. Locate the [facility] provider you are looking for under [facility] and page to a number that you can be directly reached. If you still have difficulty reaching the provider, please page the [facility] (Director on Call) for the Hospitalists listed on amion for assistance.

## 2022-07-04 NOTE — TOC Initial Note (Signed)
Transition of Care [facility]) - Initial/Assessment Note    Patient Details  Name: Laurie Olsen MRN: [id number] Date of Birth: April 04, 1960  [facility]) CM/SW Contact:    Elliot Gault, LCSW Phone Number: 07/04/2022, 11:15 AM  Clinical Narrative:                  Pt admitted from home. MD states pt will dc later today or tomorrow.  Received TOC consult for medication assistance. Pt has [place] Medicaid and there is no other assistance available for pt at this time. TOC did meet with pt at bedside to explain and to provide community resource guides for SDOH concerns. Unfortunately, TOC unable to find version of the guide in Spanish but pt stated that her dtr in law would be able to assist with interpreting the English version.  No other TOC needs identified.  Expected Discharge Plan: Home/Self Care Barriers to Discharge: Continued Medical Work up   Patient Goals and CMS Choice Patient states their goals for this hospitalization and ongoing recovery are:: go home          Expected Discharge Plan and Services In-house Referral: Clinical Social Work     Living arrangements for the past 2 months: Single Family Home                                      Prior Living Arrangements/Services Living arrangements for the past 2 months: Single Family Home Lives with:: Spouse Patient language and need for interpreter reviewed:: Yes Do you feel safe going back to the place where you live?: Yes      Need for Family Participation in Patient Care: No (Comment)     Criminal Activity/Legal Involvement Pertinent to Current Situation/Hospitalization: No - Comment as needed  Activities of Daily Living Home Assistive Devices/Equipment: None ADL Screening (condition at time of admission) Patient's cognitive ability adequate to safely complete daily activities?: Yes Is the patient deaf or have difficulty hearing?: No Does the patient have difficulty seeing, even when wearing  glasses/contacts?: No Does the patient have difficulty concentrating, remembering, or making decisions?: No Patient able to express need for assistance with ADLs?: Yes Does the patient have difficulty dressing or bathing?: No Independently performs ADLs?: Yes (appropriate for developmental age) Does the patient have difficulty walking or climbing stairs?: Yes Weakness of Legs: None Weakness of Arms/Hands: None  Permission Sought/Granted                  Emotional Assessment Appearance:: Appears stated age Attitude/Demeanor/Rapport: Engaged Affect (typically observed): Pleasant Orientation: : Oriented to Self, Oriented to Place, Oriented to  Time, Oriented to Situation Alcohol / Substance Use: Not Applicable Psych Involvement: No (comment)  Admission diagnosis:  Syncope and collapse [R55] Syncope [R55] Patient Active Problem List   Diagnosis Date Noted   Syncope 07/03/2022   Asthma, chronic 07/03/2022   HTN (hypertension) 07/03/2022   Pre-diabetes 07/03/2022   Hypokalemia 07/03/2022   PCP:  [facility], [facility] Public Pharmacy:   [facility] [address], [place] - [address] [place] [address] Phone: [phone] Fax: [phone]     Social Determinants of Health (SDOH) Social History: SDOH Screenings   Food Insecurity: Food Insecurity Present (07/03/2022)  Housing: Low Risk  (07/03/2022)  Transportation Needs: Unmet Transportation Needs (07/03/2022)  Utilities: At Risk (07/03/2022)  Tobacco Use: Medium Risk (07/03/2022)  SDOH Interventions: Food Insecurity Interventions: Inpatient TOC Transportation Interventions: Inpatient TOC Utilities Interventions: Inpatient TOC   Readmission Risk Interventions     No data to display

## 2022-07-04 NOTE — Progress Notes (Signed)
  Echocardiogram 2D Echocardiogram has been performed.  Maren Reamer 07/04/2022, 10:18 AM

## 2022-07-04 NOTE — Progress Notes (Signed)
Md at bedside

## 2022-07-04 NOTE — Evaluation (Signed)
Physical Therapy Evaluation Patient Details Name: Laurie Olsen MRN: [id number] DOB: 10/22/1960 Today's Date: 07/04/2022  History of Present Illness  Laurie Olsen is a 62 y.o. female with medical history significant for asthma, depression, hypertension, prediabetes.  Patient is Spanish-speaking, she is able to communicate in little but sufficient English to give me history.  Husband is at bedside, patient was in the waiting room when she passed out.  Her son (in his 40s) is ill and was transferred to [facility], for abdominal pathology- she is unable to tell me more details.  She has been feeling sick over the past 3 days, with poor oral intake over the past 3 days due to the poor appetite.  No vomiting no loose stools.  No urinary symptoms.  She reports bilateral lower rib chest pain that hurts with any degree of movement at all.  She reports difficulty breathing over the past few days for which she used her asthma inhaler but she has run out.  She reports minimal nonproductive cough.   Prior to passing out she felt dizzy.  Per ED staff she was out for about 30 seconds. When she came to, she was diaphoretic.   Clinical Impression  Patient functioning near baseline for functional mobility and gait demonstrating good return for bed mobility, transfers and ambulating in room, hallway without loss of balance with slightly labored movement.  Patient encouraged to ambulate daily with nursing staff and/or family member supervising for length of stay.  Plan:  Patient discharged from physical therapy to care of nursing for ambulation daily as tolerated for length of stay.         Recommendations for follow up therapy are one component of a multi-disciplinary discharge planning process, led by the attending physician.  Recommendations may be updated based on patient status, additional functional criteria and insurance authorization.  Follow Up Recommendations       Assistance Recommended at Discharge PRN   Patient can return home with the following  Help with stairs or ramp for entrance    Equipment Recommendations None recommended by PT  Recommendations for Other Services       Functional Status Assessment Patient has had a recent decline in their functional status and/or demonstrates limited ability to make significant improvements in function in a reasonable and predictable amount of time     Precautions / Restrictions Precautions Precautions: None Restrictions Weight Bearing Restrictions: No      Mobility  Bed Mobility Overal bed mobility: Modified Independent                  Transfers Overall transfer level: Modified independent                      Ambulation/Gait Ambulation/Gait assistance: Modified independent (Device/Increase time) Gait Distance (Feet): 120 Feet Assistive device: None Gait Pattern/deviations: WFL(Within Functional Limits) Gait velocity: decreased     General Gait Details: grossly WFL with good return for amblating in room, hallway without loss of balance without requiring need for an AD  Stairs            Wheelchair Mobility    Modified Rankin (Stroke Patients Only)       Balance Overall balance assessment: No apparent balance deficits (not formally assessed)  Pertinent Vitals/Pain Pain Assessment Pain Assessment: 0-10 Pain Score: 8  Pain Location: chest in sternal area Pain Descriptors / Indicators: Sore, Discomfort Pain Intervention(s): Limited activity within patient's tolerance, Monitored during session, Repositioned    Home Living Family/patient expects to be discharged to:: Private residence Living Arrangements: Spouse/significant other Available Help at Discharge: Family;Available 24 hours/day Type of Home: Mobile home Home Access: Stairs to enter Entrance Stairs-Rails: None Entrance Stairs-Number of Steps: 1   Home Layout: One  level Home Equipment: None;Grab bars - tub/shower;Shower seat      Prior Function Prior Level of Function : Independent/Modified Independent             Mobility Comments: Community ambulator without AD, does not drive ADLs Comments: Independent     Hand Dominance        Extremity/Trunk Assessment   Upper Extremity Assessment Upper Extremity Assessment: Overall WFL for tasks assessed    Lower Extremity Assessment Lower Extremity Assessment: Overall WFL for tasks assessed    Cervical / Trunk Assessment Cervical / Trunk Assessment: Normal  Communication   Communication: No difficulties  Cognition Arousal/Alertness: Awake/alert Behavior During Therapy: WFL for tasks assessed/performed Overall Cognitive Status: Within Functional Limits for tasks assessed                                          General Comments      Exercises     Assessment/Plan    PT Assessment Patient does not need any further PT services  PT Problem List         PT Treatment Interventions      PT Goals (Current goals can be found in the Care Plan section)  Acute Rehab PT Goals Patient Stated Goal: return home with family to assist PT Goal Formulation: With patient/family Time For Goal Achievement: 07/04/22 Potential to Achieve Goals: Good    Frequency       Co-evaluation               AM-PAC PT "6 Clicks" Mobility  Outcome Measure Help needed turning from your back to your side while in a flat bed without using bedrails?: None Help needed moving from lying on your back to sitting on the side of a flat bed without using bedrails?: None Help needed moving to and from a bed to a chair (including a wheelchair)?: None Help needed standing up from a chair using your arms (e.g., wheelchair or bedside chair)?: None Help needed to walk in hospital room?: None Help needed climbing 3-5 steps with a railing? : A Little 6 Click Score: 23    End of Session    Activity Tolerance: Patient tolerated treatment well;Patient limited by fatigue Patient left: in bed;with call bell/phone within reach;with family/visitor present Nurse Communication: Mobility status PT Visit Diagnosis: Unsteadiness on feet (R26.81);Other abnormalities of gait and mobility (R26.89);Muscle weakness (generalized) (M62.81)    Time: 1610-9604 PT Time Calculation (min) (ACUTE ONLY): 20 min   Charges:   PT Evaluation $PT Eval Moderate Complexity: 1 Mod PT Treatments $Therapeutic Activity: 8-22 mins        11:29 AM, 07/04/22 Ocie Bob, MPT Physical Therapist with [facility] [phone] office [phone] mobile phone

## 2022-07-04 NOTE — Progress Notes (Signed)
Pt reported palpitations and 10/10 pain in chest. EKG done and in chart. Pt stated she feels lightheaded, dizzy, and "like how you feel when you hallucinate after taking medication." MD notified. PRN Norco and Tylenol given during the night. Vitals stable.

## 2022-07-04 NOTE — Discharge Instructions (Signed)
IMPORTANT INFORMATION: PAY CLOSE ATTENTION   PHYSICIAN DISCHARGE INSTRUCTIONS  Follow with Primary care provider  [facility], [facility]  and other consultants as instructed by your Hospitalist Physician  SEEK MEDICAL CARE OR RETURN TO EMERGENCY ROOM IF SYMPTOMS COME BACK, WORSEN OR NEW PROBLEM DEVELOPS   Please note: You were cared for by a hospitalist during your hospital stay. Every effort will be made to forward records to your primary care provider.  You can request that your primary care provider send for your hospital records if they have not received them.  Once you are discharged, your primary care physician will handle any further medical issues. Please note that NO REFILLS for any discharge medications will be authorized once you are discharged, as it is imperative that you return to your primary care physician (or establish a relationship with a primary care physician if you do not have one) for your post hospital discharge needs so that they can reassess your need for medications and monitor your lab values.  Please get a complete blood count and chemistry panel checked by your Primary MD at your next visit, and again as instructed by your Primary MD.  Get Medicines reviewed and adjusted: Please take all your medications with you for your next visit with your Primary MD  Laboratory/radiological data: Please request your Primary MD to go over all hospital tests and procedure/radiological results at the follow up, please ask your primary care provider to get all Hospital records sent to his/her office.  In some cases, they will be blood work, cultures and biopsy results pending at the time of your discharge. Please request that your primary care provider follow up on these results.  If you are diabetic, please bring your blood sugar readings with you to your follow up appointment with primary care.    Please call and make your follow up appointments as soon as possible.     Also Note the following: If you experience worsening of your admission symptoms, develop shortness of breath, life threatening emergency, suicidal or homicidal thoughts you must seek medical attention immediately by calling 911 or calling your MD immediately  if symptoms less severe.  You must read complete instructions/literature along with all the possible adverse reactions/side effects for all the Medicines you take and that have been prescribed to you. Take any new Medicines after you have completely understood and accpet all the possible adverse reactions/side effects.   Do not drive when taking Pain medications or sleeping medications (Benzodiazepines)  Do not take more than prescribed Pain, Sleep and Anxiety Medications. It is not advisable to combine anxiety,sleep and pain medications without talking with your primary care practitioner  Special Instructions: If you have smoked or chewed Tobacco  in the last 2 yrs please stop smoking, stop any regular Alcohol  and or any Recreational drug use.  Wear Seat belts while driving.  Do not drive if taking any narcotic, mind altering or controlled substances or recreational drugs or alcohol.       

## 2022-09-02 IMAGING — MG MM PLC BREAST LOC DEV 1ST LESION INC*R*
7 series · 7 of 7 positions shown · non-contrast
Comparison: Previous exam(s).

CLINICAL DATA: Pre surgical excision localization of recently
diagnosed right breast ADH, marked with an X shaped biopsy marker
clip.

EXAM:
MAMMOGRAPHIC GUIDED RADIOACTIVE SEED LOCALIZATION OF THE RIGHT
BREAST

[R CC (1 of 4)]
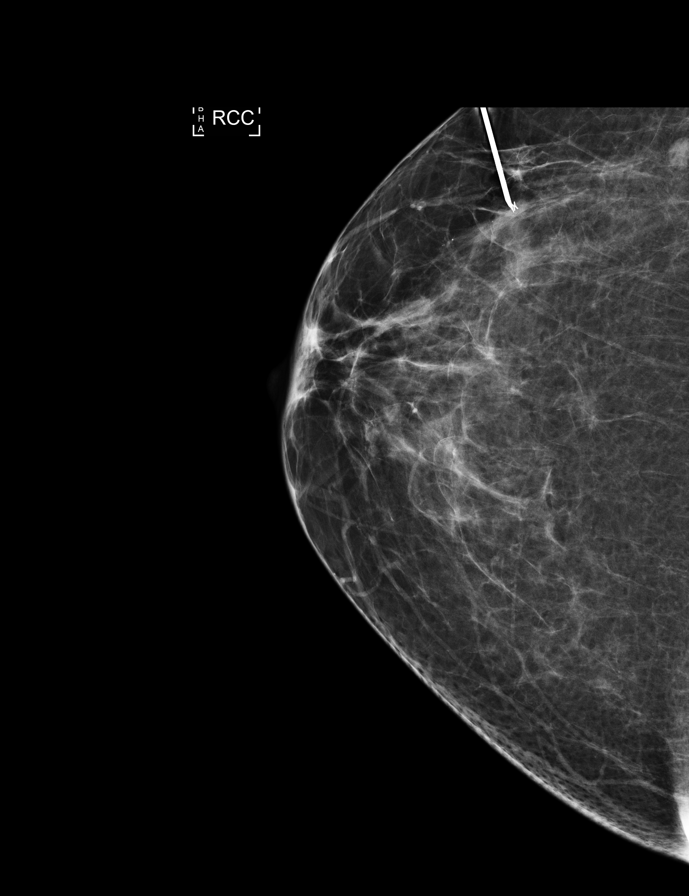

[R LM (1 of 3)]
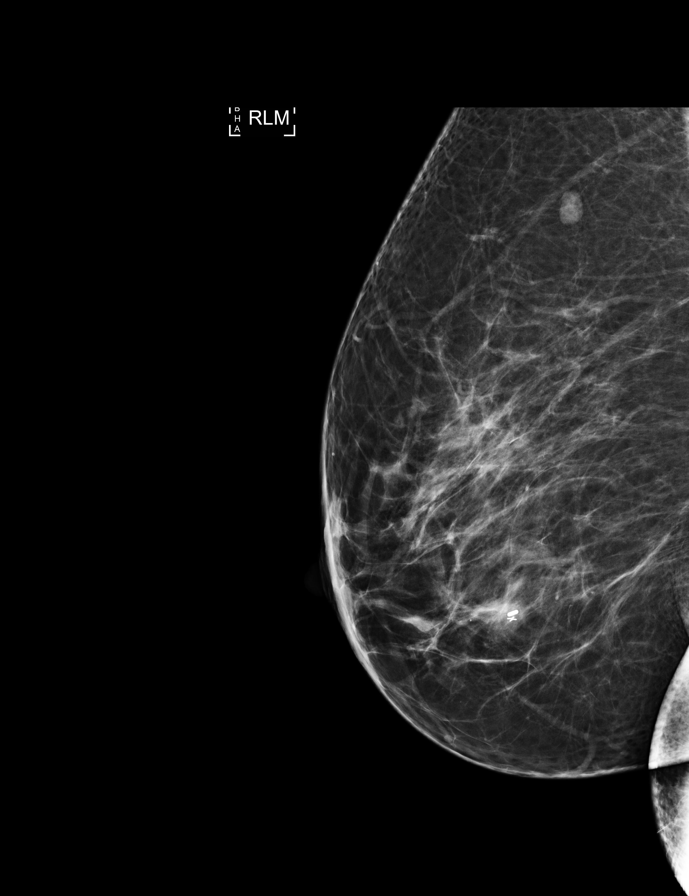

[R CC (2 of 4)]
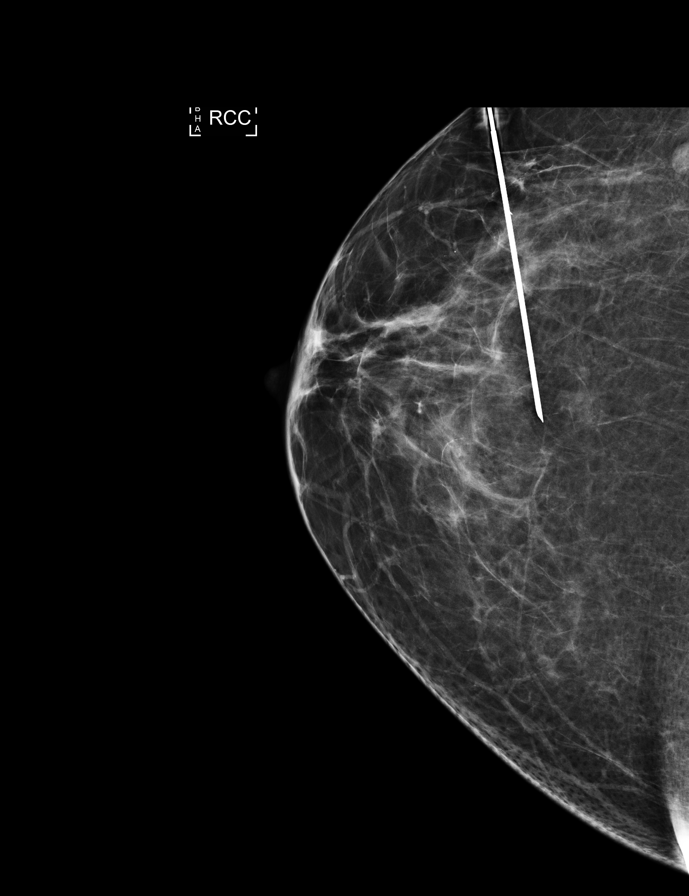

[R LM (2 of 3)]
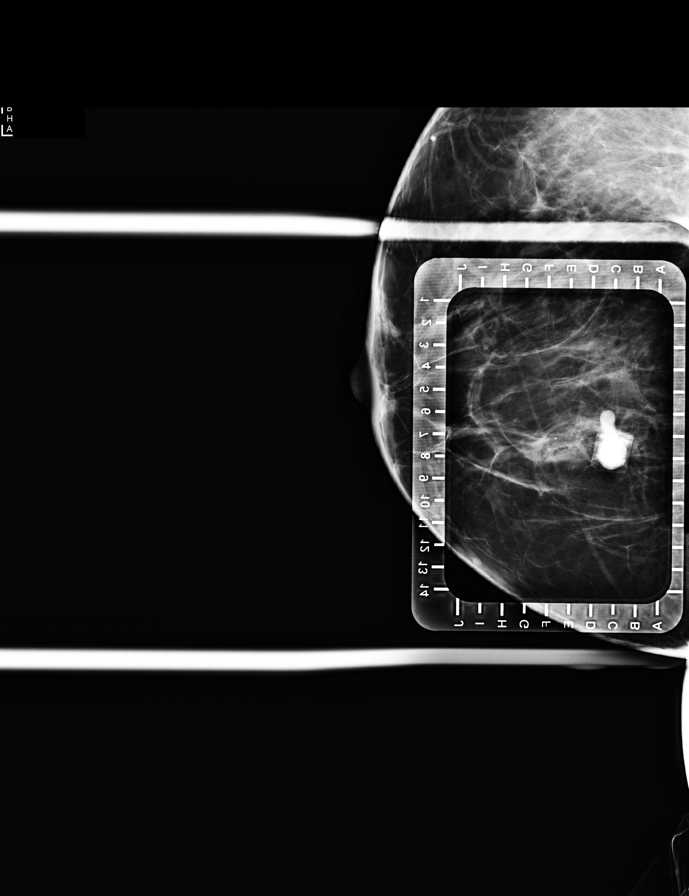

[R LM (3 of 3)]
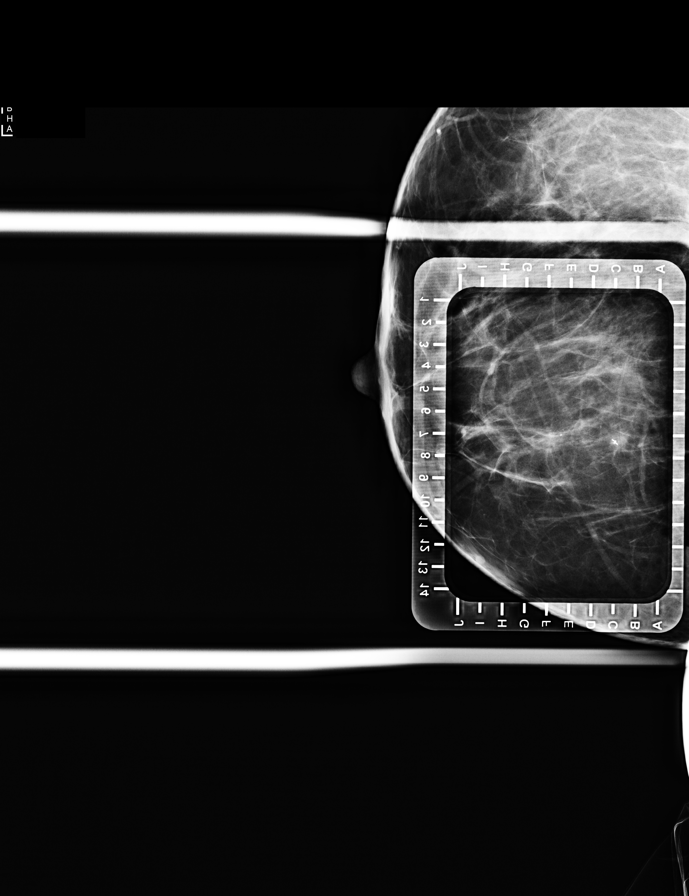

[R CC (3 of 4)]
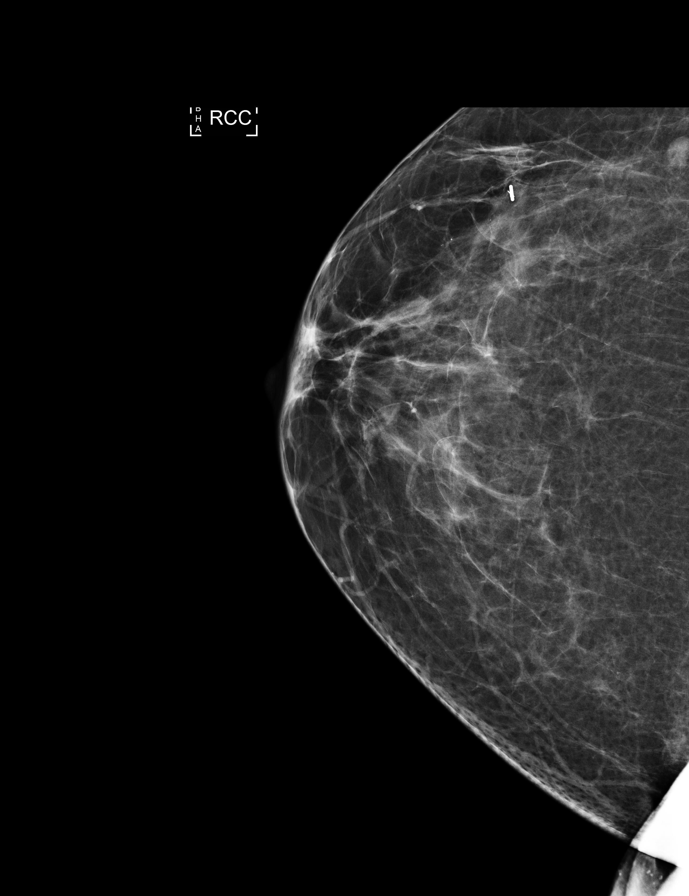

[R CC (4 of 4)]
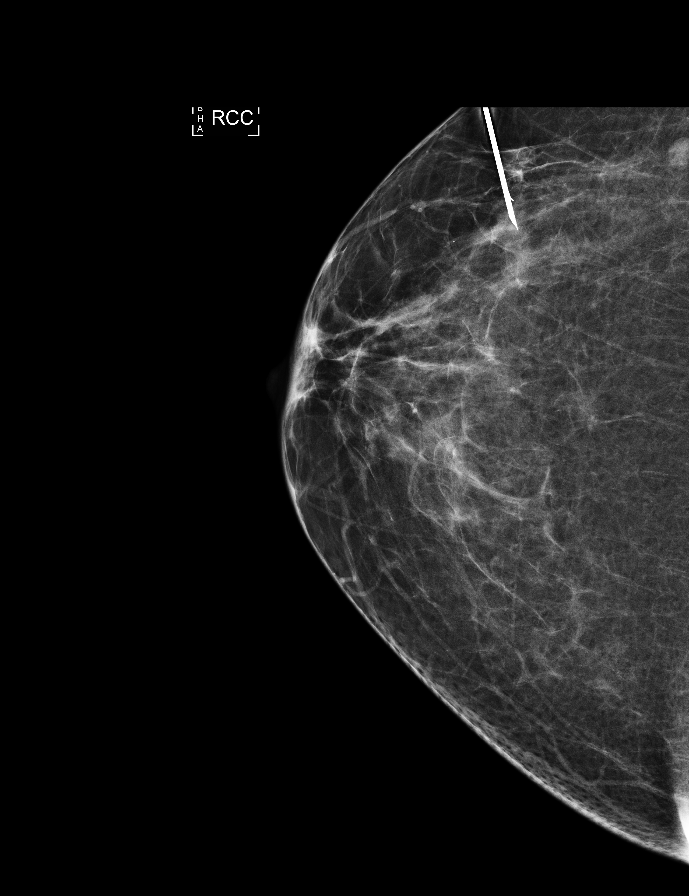

[7 of 7 positions shown; findings below may reference images not displayed]

FINDINGS: Patient presents for radioactive seed localization prior to right
breast excision. I met with the patient and we discussed the
procedure of seed localization including benefits and alternatives.
We discussed the high likelihood of a successful procedure. We
discussed the risks of the procedure including infection, bleeding,
tissue injury and further surgery. We discussed the low dose of
radioactivity involved in the procedure. Informed, written consent
was given.

The usual time-out protocol was performed immediately prior to the
procedure.

Using mammographic guidance, sterile technique, 1% lidocaine and an
[id number] radioactive seed, the recently placed X shaped biopsy marker
clip was localized using a lateral approach. The follow-up mammogram
images confirm the seed in the expected location and were marked for
Dr. Pranay.

Follow-up survey of the patient confirms presence of the radioactive
seed.

Order number of [id number] seed:  [id number].

Total activity:  0.240 mCi reference Date: 10/25/2020

The patient tolerated the procedure well and was released from the
[REDACTED]. She was given instructions regarding seed removal.
IMPRESSION: Radioactive seed localization right breast. No apparent
complications.

## 2022-09-03 IMAGING — MG MM BREAST SURGICAL SPECIMEN
1 series · 2 of 2 positions shown · non-contrast
Comparison: Previous exam(s).

CLINICAL DATA: Patient status post right breast excision.

EXAM:
SPECIMEN RADIOGRAPH OF THE RIGHT BREAST

[Series 1: R · right · 0.07mm/px · 2 of 2 slices shown]
[im 1/2]
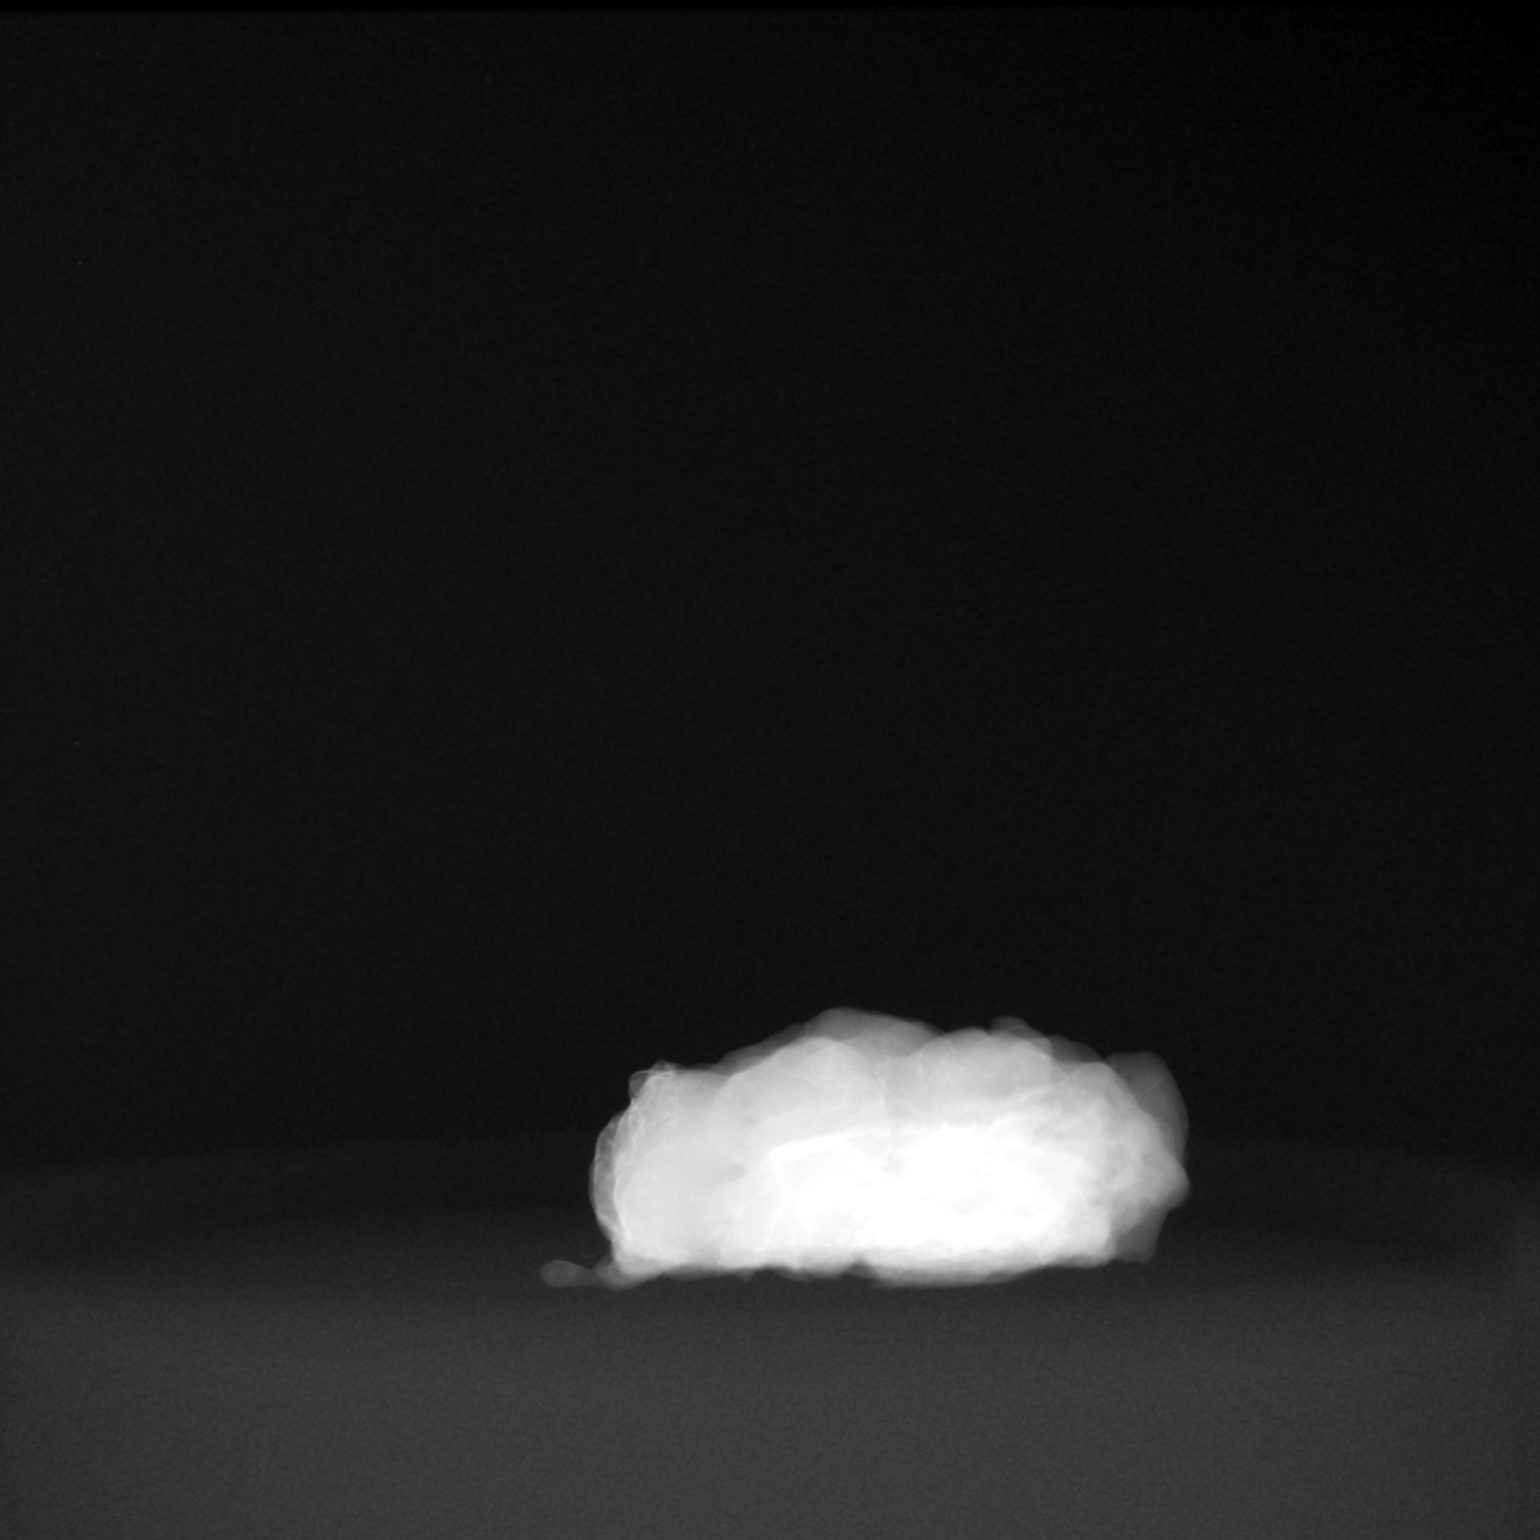
[im 2/2]
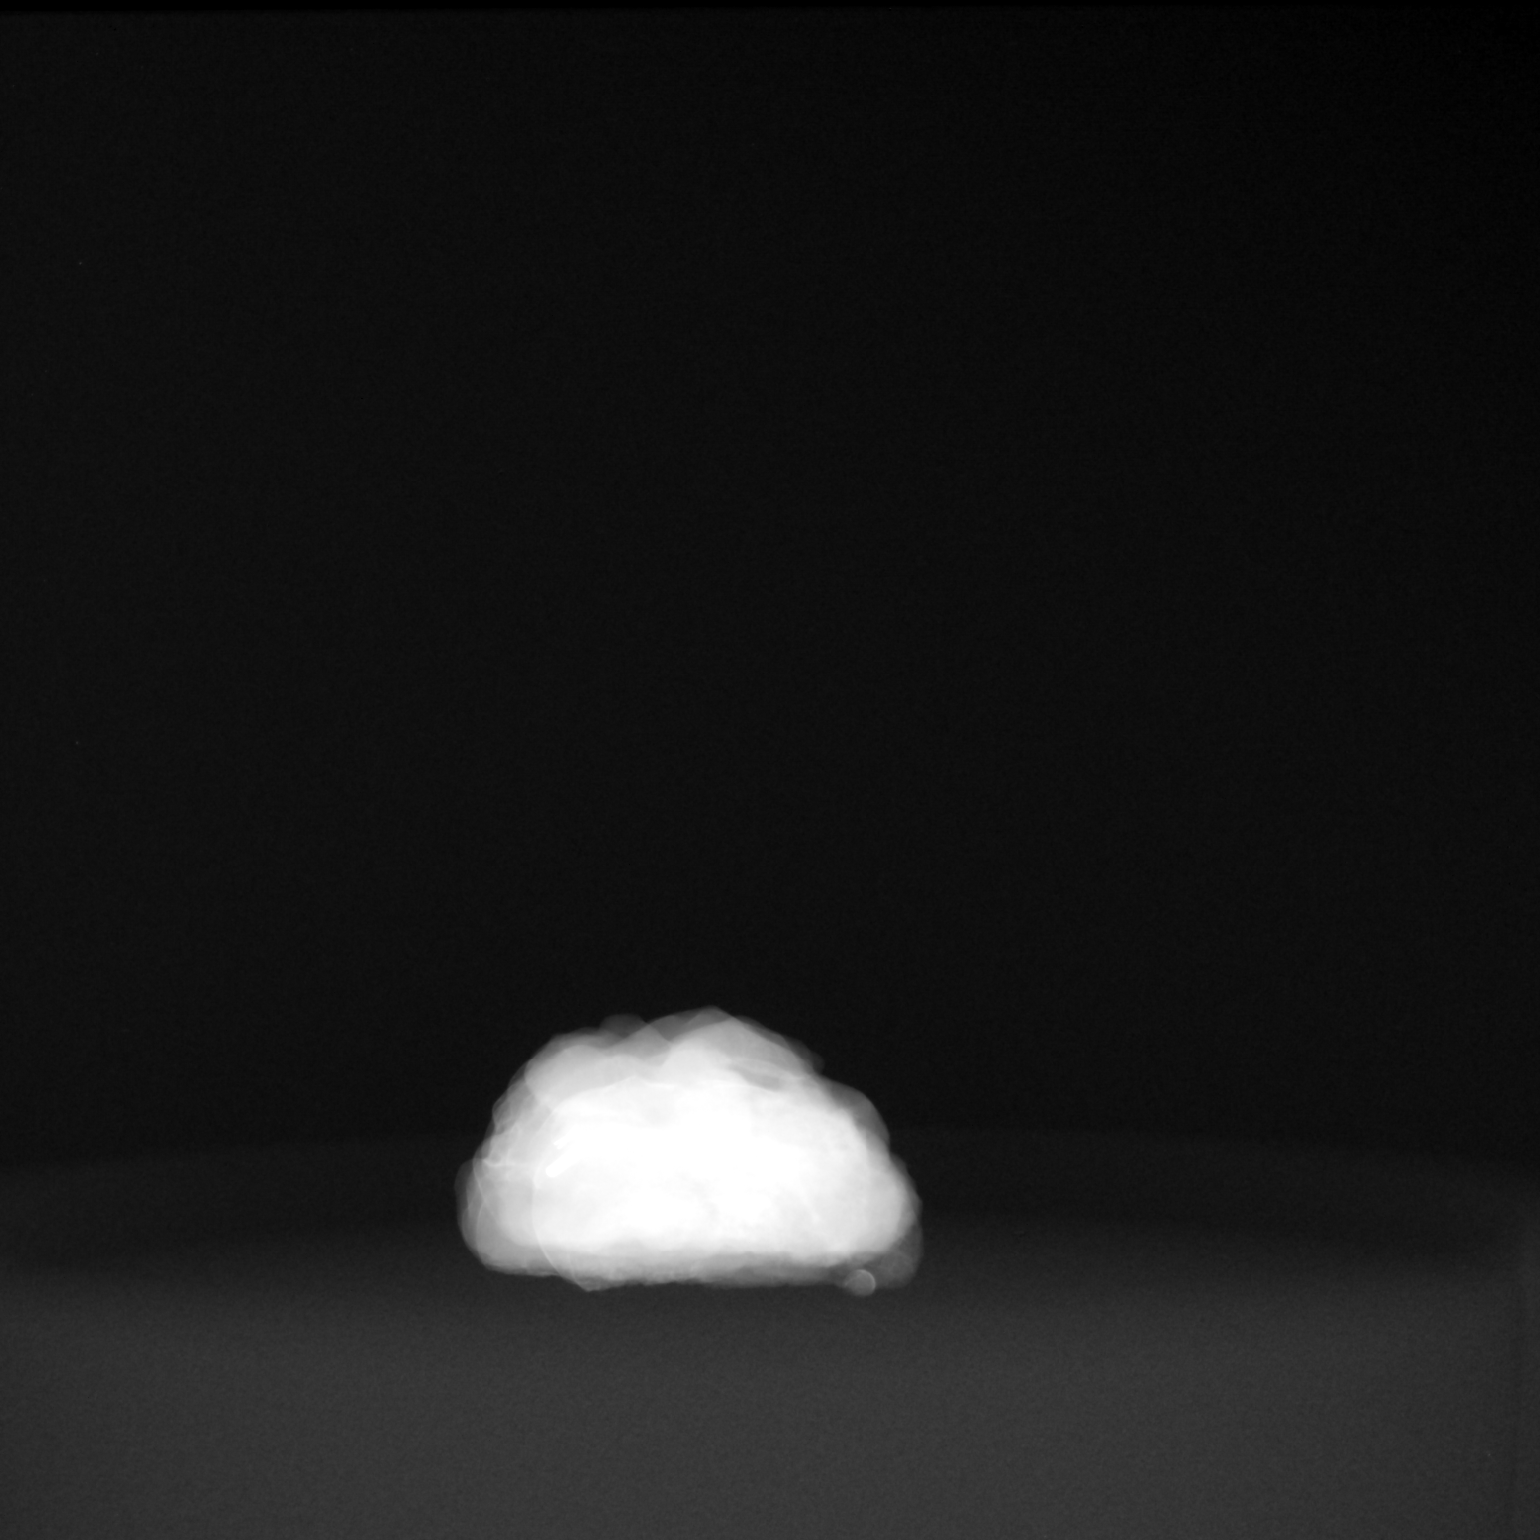

[2 of 2 positions shown; findings below may reference images not displayed]

FINDINGS: Status post excision of the right breast. The radioactive seed and
biopsy marker clip are present and completely intact.
IMPRESSION: Specimen radiograph of the right breast.

## 2022-09-28 DIAGNOSIS — G4700 Insomnia, unspecified: Secondary | ICD-10-CM | POA: Insufficient documentation

## 2022-09-28 DIAGNOSIS — E782 Mixed hyperlipidemia: Secondary | ICD-10-CM | POA: Insufficient documentation

## 2022-09-28 DIAGNOSIS — F32A Depression, unspecified: Secondary | ICD-10-CM | POA: Insufficient documentation

## 2023-04-03 DIAGNOSIS — F419 Anxiety disorder, unspecified: Secondary | ICD-10-CM | POA: Insufficient documentation

## 2023-04-03 DIAGNOSIS — K219 Gastro-esophageal reflux disease without esophagitis: Secondary | ICD-10-CM | POA: Insufficient documentation

## 2023-04-03 DIAGNOSIS — Z4000 Encounter for prophylactic removal of unspecified organ: Secondary | ICD-10-CM | POA: Insufficient documentation

## 2023-04-03 DIAGNOSIS — E669 Obesity, unspecified: Secondary | ICD-10-CM | POA: Insufficient documentation

## 2023-04-05 DIAGNOSIS — E559 Vitamin D deficiency, unspecified: Secondary | ICD-10-CM | POA: Insufficient documentation

## 2023-04-05 DIAGNOSIS — E119 Type 2 diabetes mellitus without complications: Secondary | ICD-10-CM | POA: Insufficient documentation

## 2023-04-09 ENCOUNTER — Encounter (INDEPENDENT_AMBULATORY_CARE_PROVIDER_SITE_OTHER): Payer: Self-pay | Admitting: *Deleted

## 2023-04-09 ENCOUNTER — Encounter: Payer: Self-pay | Admitting: *Deleted

## 2023-10-10 ENCOUNTER — Encounter (INDEPENDENT_AMBULATORY_CARE_PROVIDER_SITE_OTHER): Payer: Self-pay | Admitting: *Deleted

## 2024-01-02 ENCOUNTER — Ambulatory Visit: Admitting: Family Medicine

## 2024-01-02 DIAGNOSIS — Z1211 Encounter for screening for malignant neoplasm of colon: Secondary | ICD-10-CM | POA: Insufficient documentation

## 2024-01-02 DIAGNOSIS — Z09 Encounter for follow-up examination after completed treatment for conditions other than malignant neoplasm: Secondary | ICD-10-CM | POA: Insufficient documentation

## 2024-01-02 DIAGNOSIS — J069 Acute upper respiratory infection, unspecified: Secondary | ICD-10-CM | POA: Insufficient documentation

## 2024-01-02 DIAGNOSIS — J4520 Mild intermittent asthma, uncomplicated: Secondary | ICD-10-CM | POA: Insufficient documentation

## 2024-01-16 ENCOUNTER — Ambulatory Visit: Admitting: Student in an Organized Health Care Education/Training Program
